# Patient Record
Sex: Male | Born: 1960 | Race: White | Hispanic: No | Marital: Married | Smoking: Never smoker
Health system: Southern US, Community
[De-identification: ages and names within clinical notes are randomized; demographics above are authoritative.]

## PROBLEM LIST (undated history)

## (undated) DIAGNOSIS — E119 Type 2 diabetes mellitus without complications: Secondary | ICD-10-CM

---

## 2005-01-07 ENCOUNTER — Ambulatory Visit (HOSPITAL_COMMUNITY): Admission: RE | Admit: 2005-01-07 | Discharge: 2005-01-07 | Payer: Self-pay | Admitting: Orthopedic Surgery

## 2005-01-07 ENCOUNTER — Ambulatory Visit (HOSPITAL_BASED_OUTPATIENT_CLINIC_OR_DEPARTMENT_OTHER): Admission: RE | Admit: 2005-01-07 | Discharge: 2005-01-07 | Payer: Self-pay | Admitting: Orthopedic Surgery

## 2007-01-22 ENCOUNTER — Ambulatory Visit (HOSPITAL_BASED_OUTPATIENT_CLINIC_OR_DEPARTMENT_OTHER): Admission: RE | Admit: 2007-01-22 | Discharge: 2007-01-22 | Payer: Self-pay | Admitting: Orthopedic Surgery

## 2007-10-12 ENCOUNTER — Ambulatory Visit: Payer: Self-pay | Admitting: Pulmonary Disease

## 2009-06-06 ENCOUNTER — Telehealth (INDEPENDENT_AMBULATORY_CARE_PROVIDER_SITE_OTHER): Payer: Self-pay | Admitting: *Deleted

## 2009-06-07 ENCOUNTER — Ambulatory Visit: Payer: Self-pay

## 2009-06-07 ENCOUNTER — Encounter: Payer: Self-pay | Admitting: Internal Medicine

## 2010-01-21 ENCOUNTER — Encounter (INDEPENDENT_AMBULATORY_CARE_PROVIDER_SITE_OTHER): Payer: Self-pay | Admitting: *Deleted

## 2010-01-21 ENCOUNTER — Encounter: Payer: Self-pay | Admitting: Gastroenterology

## 2010-01-29 ENCOUNTER — Ambulatory Visit: Payer: Self-pay | Admitting: Gastroenterology

## 2010-01-29 DIAGNOSIS — E119 Type 2 diabetes mellitus without complications: Secondary | ICD-10-CM | POA: Insufficient documentation

## 2010-02-08 ENCOUNTER — Ambulatory Visit: Payer: Self-pay | Admitting: Gastroenterology

## 2010-12-10 NOTE — Letter (Signed)
Summary: Insulin pump letter-Colonoscopy  [facility]  [address] [place], [place] [address]   Phone: [phone]  Fax: [phone]      Date: January 29, 2010  Re: Scott Mosley DOB: 25-Apr-1961 MRN: [id number]     Dear Dr. Evlyn Kanner:   Dr. Russella Dar has scheduled the above patient for a colonoscopy at 8:30am on 02/08/10.  Our records show that he/she is on insulin therapy via an insulin pump.  Our colonoscopy prep protocol requires that:   the patient must be on a clear liquid diet the entire day prior to the procedure date as well as the morning of the procedure   the patient must be NPO for 2 hours prior to the procedure    the patient must consume a PEG 3350 solution to prepare for the procedure.  Please advise [place] of any adjustments that need to be made to the patient's insulin pump therapy prior to the above procedure date.    Please route or fax back this completed form to me at [phone]  If you have any question, please call me at [phone].  Thank you for your help with this matter.  Sincerely,     Physician Recommendation:  ________________________________________________  ________________________________________________________________________  ________________________________________________________________________  ________________________________________________________________________  Appended Document: Insulin pump letter-Colonoscopy I advised patient that for his procedure he should do a 10% reduction of his bolus per Dr. Evlyn Kanner. The letter will be scanned in to EMR.

## 2010-12-10 NOTE — Procedures (Signed)
Summary: Colonoscopy  Patient: [name] Note: All result statuses are Final unless otherwise noted.  Tests: (1) Colonoscopy (COL)   COL Colonoscopy           DONE     [facility]     [address]     [place], [place]  [address]           COLONOSCOPY PROCEDURE REPORT           PATIENT:  Scott Mosley, Scott Mosley  MR#:  [id number]     BIRTHDATE:  [birth year], 49 yrs. old  GENDER:  male           ENDOSCOPIST:  Judie Petit T. Russella Dar, MD, [facility]     Referred by:  Adrian Prince, M.D.           PROCEDURE DATE:  02/08/2010     PROCEDURE:  Average-risk screening colonoscopy G0121     ASA CLASS:  Class II     INDICATIONS:  1) Routine Risk Screening           MEDICATIONS:   Fentanyl 75 mcg IV, Versed 9 mg IV           DESCRIPTION OF PROCEDURE:   After the risks benefits and     alternatives of the procedure were thoroughly explained, informed     consent was obtained.  Digital rectal exam was performed and     revealed no abnormalities.   The LB PCF-H180AL [id number] endoscope     was introduced through the anus and advanced to the cecum, which     was identified by both the appendix and ileocecal valve, limited     by a tortuous colon, fair prep.  The quality of the prep was     Moviprep fair, adequate.  The instrument was then slowly withdrawn     as the colon was fully examined.     <<PROCEDUREIMAGES>>           FINDINGS:  A normal appearing cecum, ileocecal valve, and     appendiceal orifice were identified. The ascending, hepatic     flexure, transverse, splenic flexure, descending, sigmoid colon,     and rectum appeared unremarkable.   Retroflexed views in the     rectum revealed no abnormalities.    The time to cecum =  3.67     minutes. The scope was then withdrawn (time =  11.5  min) from the     patient and the procedure completed.           COMPLICATIONS:  None           ENDOSCOPIC IMPRESSION:     1) Normal colon           RECOMMENDATIONS:     1) Repeat Colonscopy in 7 years for  routine CRC screening due to     fair prep and tortuous colon.           Venita Lick. Russella Dar, MD, [facility]           n.     eSIGNED:   Venita Lick. Neely Kammerer at 02/08/2010 08:32 AM           Ricke Hey, [phone]  Note: An exclamation mark (!) indicates a result that was not dispersed into the flowsheet. Document Creation Date: 02/08/2010 8:33 AM _______________________________________________________________________  (1) Order result status: Final Collection or observation date-time: 02/08/2010 08:29 Requested date-time:  Receipt date-time:  Reported date-time:  Referring Physician:   Ordering  Physician: Claudette Head [phone]) Specimen Source:  Source: Launa Grill Order Number: [id number] Lab site:   Appended Document: Colonoscopy    Clinical Lists Changes  Observations: Added new observation of COLONNXTDUE: 02/2017 (02/08/2010 12:55)

## 2010-12-10 NOTE — Procedures (Signed)
Summary: Insulin letter/[facility]  Insulin letter/ Gastroenterology   Imported By: Lester Millerville 02/11/2010 08:30:29  _____________________________________________________________________  External Attachment:    Type:   Image     Comment:   External Document

## 2010-12-10 NOTE — Letter (Signed)
Summary: [facility] Instructions  [facility]  [address] [place], [place] [address]   Phone: [phone]  Fax: [phone]       Scott Mosley    [birth year]    MRN: [id number]        Procedure Day /Date: Friday April 1st, 2011     Arrival Time: 7:30am     Procedure Time: 8:30am     Location of Procedure:                    _x _  [facility] (4th Floor)                        PREPARATION FOR COLONOSCOPY WITH MOVIPREP   Starting 5 days prior to your procedure  02/04/10 do not eat nuts, seeds, popcorn, corn, beans, peas,  salads, or any raw vegetables.  Do not take any fiber supplements (e.g. Metamucil, Citrucel, and Benefiber).  THE DAY BEFORE YOUR PROCEDURE         DATE:  02/07/10  DAY:  Thursday  1.  Drink clear liquids the entire day-NO SOLID FOOD  2.  Do not drink anything colored red or purple.  Avoid juices with pulp.  No orange juice.  3.  Drink at least 64 oz. (8 glasses) of fluid/clear liquids during the day to prevent dehydration and help the prep work efficiently.  CLEAR LIQUIDS INCLUDE: Water Jello Ice Popsicles Tea (sugar ok, no milk/cream) Powdered fruit flavored drinks Coffee (sugar ok, no milk/cream) Gatorade Juice: apple, white grape, white cranberry  Lemonade Clear bullion, consomm, broth Carbonated beverages (any kind) Strained chicken noodle soup Hard Candy                             4.  In the morning, mix first dose of MoviPrep solution:    Empty 1 Pouch A and 1 Pouch B into the disposable container    Add lukewarm drinking water to the top line of the container. Mix to dissolve    Refrigerate (mixed solution should be used within 24 hrs)  5.  Begin drinking the prep at 5:00 p.m. The MoviPrep container is divided by 4 marks.   Every 15 minutes drink the solution down to the next mark (approximately 8 oz) until the full liter is complete.   6.  Follow completed prep with 16 oz of clear liquid of your choice  (Nothing red or purple).  Continue to drink clear liquids until bedtime.  7.  Before going to bed, mix second dose of MoviPrep solution:    Empty 1 Pouch A and 1 Pouch B into the disposable container    Add lukewarm drinking water to the top line of the container. Mix to dissolve    Refrigerate  THE DAY OF YOUR PROCEDURE      DATE:  02/08/10  DAY:  Friday  Beginning at  3:30 a.m. (5 hours before procedure):         1. Every 15 minutes, drink the solution down to the next mark (approx 8 oz) until the full liter is complete.  2. Follow completed prep with 16 oz. of clear liquid of your choice.    3. You may drink clear liquids until  6:30am  (2 HOURS BEFORE PROCEDURE).   MEDICATION INSTRUCTIONS  Unless otherwise instructed, you should take regular prescription medications with a small sip of water  as early as possible the morning of your procedure.  Diabetic patients - see separate instructions.          OTHER INSTRUCTIONS  You will need a responsible adult at least 52 years of age to accompany you and drive you home.   This person must remain in the waiting room during your procedure.  Wear loose fitting clothing that is easily removed.  Leave jewelry and other valuables at home.  However, you may wish to bring a book to read or  an iPod/MP3 player to listen to music as you wait for your procedure to start.  Remove all body piercing jewelry and leave at home.  Total time from sign-in until discharge is approximately 2-3 hours.  You should go home directly after your procedure and rest.  You can resume normal activities the  day after your procedure.  The day of your procedure you should not:   Drive   Make legal decisions   Operate machinery   Drink alcohol   Return to work  You will receive specific instructions about eating, activities and medications before you leave.    The above instructions have been reviewed and explained to me by   Marchelle Folks.      I fully understand and can verbalize these instructions _____________________________ Date _________

## 2010-12-10 NOTE — Letter (Signed)
Summary: [facility]  [facility]   Imported By: Sherian Rein 02/04/2010 08:03:58  _____________________________________________________________________  External Attachment:    Type:   Image     Comment:   External Document

## 2010-12-10 NOTE — Letter (Signed)
Summary: Diabetic Instructions  [facility]  [address] [place], [place] [address]   Phone: [phone]  Fax: [phone]    Scott Mosley [birth year] MRN: [id number]    _x _   INSULIN PUMP MEDICATION INSTRUCTIONS  We will contact the physician managing your diabetic care for written dosage instructions for the day before your procedure and the day of your procedure.  Once we have received the instructions, we will contact you.

## 2010-12-10 NOTE — Letter (Signed)
Summary: New Patient letter  [facility]  [address] [place], [place] [address]   Phone: [phone]  Fax: [phone]       01/21/2010 MRN: [id number]  Scott Mosley [address] [place], [place]  [address]  Dear Scott Mosley,  Welcome to the Gastroenterology Division at [facility].    You are scheduled to see Dr.  Russella Dar on 01-29-10 at 11am on the 3rd floor at [facility], [address]  We ask that you try to arrive at our office 15 minutes prior to your appointment time to allow for check-in.  We would like you to complete the enclosed self-administered evaluation form prior to your visit and bring it with you on the day of your appointment.  We will review it with you.  Also, please bring a complete list of all your medications or, if you prefer, bring the medication bottles and we will list them.  Please bring your insurance card so that we may make a copy of it.  If your insurance requires a referral to see a specialist, please bring your referral form from your primary care physician.  Co-payments are due at the time of your visit and may be paid by cash, check or credit card.     Your office visit will consist of a consult with your physician (includes a physical exam), any laboratory testing he/she may order, scheduling of any necessary diagnostic testing (e.g. x-ray, ultrasound, CT-scan), and scheduling of a procedure (e.g. Endoscopy, Colonoscopy) if required.  Please allow enough time on your schedule to allow for any/all of these possibilities.    If you cannot keep your appointment, please call [phone] to cancel or reschedule prior to your appointment date.  This allows [place] the opportunity to schedule an appointment for another patient in need of care.  If you do not cancel or reschedule by 5 p.m. the business day prior to your appointment date, you will be charged a $50.00 late cancellation/no-show fee.    Thank you for choosing [facility] for your medical needs.  We appreciate the opportunity to care for you.  Please visit [place] at our website  to learn more about our practice.                     Sincerely,                                                             The Gastroenterology Division

## 2010-12-10 NOTE — Assessment & Plan Note (Signed)
Summary: SCREEN FOR COLON-ON INSULIN/YF   History of Present Illness Visit Type: Initial Consult Primary GI MD: Elie Goody MD [facility] Primary Provider: Adrian Prince, MD Requesting Provider: Adrian Prince, MD Chief Complaint: Patient here for consult for colonoscopy on insulin, he denies any GI complaints.  History of Present Illness:   This is a 50 year old male referred for colorectal cancer screening. He has diabetes mellitus managed on an insulin pump, hyperlipidemia, hypertension, and asthma, which are all stable. He has no colorectal complaints and there is no family history of colon cancer or colon polyps.   GI Review of Systems      Denies abdominal pain, acid reflux, belching, bloating, chest pain, dysphagia with liquids, dysphagia with solids, heartburn, loss of appetite, nausea, vomiting, vomiting blood, weight loss, and  weight gain.        Denies anal fissure, black tarry stools, change in bowel habit, constipation, diarrhea, diverticulosis, fecal incontinence, heme positive stool, hemorrhoids, irritable bowel syndrome, jaundice, light color stool, liver problems, rectal bleeding, and  rectal pain. Preventive Screening-Counseling & Management  Alcohol-Tobacco     Smoking Status: never      Drug Use:  no.     Current Medications (verified): 1)  Pravachol 80 Mg Tabs (Pravastatin Sodium) .... Take One By Mouth Once Daily 2)  Ramipril 10 Mg Caps (Ramipril) .... Take One By Mouth Once Daily 3)  Aspirin 81 Mg Tbec (Aspirin) .... Take One By Mouth Once Daily 4)  Humalog 100 Unit/ml Soln (Insulin Lispro (Human)) .... 28.3 Units Daily (Not Including Bolus At Meals) 5)  Multivitamins  Tabs (Multiple Vitamin) .... Take One By Mouth Once Daily  Allergies (verified): No Known Drug Allergies  Past History:  Past Medical History: Reviewed history from 01/24/2010 and no changes required. Asthma Diabetes Mellitus Type I Diabetic retinopathy Blind in left  eye Hyperlipidemia Hypertension  Past Surgical History: Laser surgery in right eye  Family History: Family History of Heart Disease: Father, paternal grandparents Family History of Ovarian Cancer:Mother Family History of Lung Cancer: Maternal Grandfather (smoker) Family History of Diabetes: paternal grandparents, father  Social History: Patient has never smoked.  Alcohol Use - no Illicit Drug Use - no Daily Caffeine Use 3-4 per day married two sons Smoking Status:  never Drug Use:  no  Review of Systems       The pertinent positives and negatives are noted as above and in the HPI. All other ROS were reviewed and were negative.   Vital Signs:  Patient profile:   50 year old male Height:      69 inches Weight:      201.2 pounds BMI:     29.82 Pulse rate:   88 / minute Pulse rhythm:   regular BP sitting:   136 / 60  (left arm) Cuff size:   regular  Vitals Entered By: Harlow Mares CMA Duncan Dull) (January 29, 2010 10:52 AM)  Physical Exam  General:  Well developed, well nourished, no acute distress. Head:  Normocephalic and atraumatic. Eyes:  PERRLA, no icterus. OS blindness. Ears:  Normal auditory acuity. Mouth:  No deformity or lesions, dentition normal. Neck:  Supple; no masses or thyromegaly. Lungs:  Clear throughout to auscultation. Heart:  Regular rate and rhythm; no murmurs, rubs,  or bruits. Abdomen:  Soft, nontender and nondistended. No masses, hepatosplenomegaly or hernias noted. Normal bowel sounds. Rectal:  deferred until time of colonoscopy.   Msk:  Symmetrical with no gross deformities. Normal posture. Pulses:  Normal pulses  noted. Extremities:  No clubbing, cyanosis, edema or deformities noted. Neurologic:  Alert and  oriented x4;  grossly normal neurologically. Cervical Nodes:  No significant cervical adenopathy. Inguinal Nodes:  No significant inguinal adenopathy. Psych:  Alert and cooperative. Normal mood and affect.  Impression &  Recommendations:  Problem # 1:  SPECIAL SCREENING FOR MALIGNANT NEOPLASMS COLON (ICD-V76.51) Average risk for colorectal cancer. The risks, benefits and alternatives to colonoscopy with possible biopsy and possible polypectomy were discussed with the patient and they consent to proceed. The procedure will be scheduled electively. Orders: Colonoscopy (Colon)  Problem # 2:  DIABETES MELLITUS (ICD-250.00) Management of his insulin pump for a clear liquid diet prior to the procedure and he is status on the morning of the procedure per Dr. Evlyn Kanner. Orders: Colonoscopy (Colon)  Patient Instructions: 1)  Colonoscopy brochure given.  2)  Please obtain instructions from Dr. Evlyn Kanner about your insulin pump. 3)  Copy sent to : Adrian Prince, MD 4)  The medication list was reviewed and reconciled.  All changed / newly prescribed medications were explained.  A complete medication list was provided to the patient / caregiver.  Prescriptions: MOVIPREP 100 GM  SOLR (PEG-KCL-NACL-NASULF-NA ASC-C) As per prep instructions.  #1 x 0   Entered by:   Christie Nottingham CMA (AAMA)   Authorized by:   Meryl Dare MD [facility]   Signed by:   Christie Nottingham CMA (AAMA) on 01/29/2010   Method used:   Electronically to        [facility]  [address] [id number] ([facility])       [address]       [place], [place]  [address]       Ph: [phone]       Fax: [phone]   RxID:   [id number]

## 2011-03-25 NOTE — Assessment & Plan Note (Signed)
Scott Mosley                             PULMONARY OFFICE NOTE   Scott Mosley, Scott Mosley                         MRN:          [id number]  DATE:10/12/2007                            DOB:          1961-11-08    HISTORY OF PRESENT ILLNESS:  Scott Mosley is a 50 year old type 1 diabetic  who is referred for an evaluation of chronic cough.  He describes cough  that has been present for several years but has worsened in the last  three months.  This is nonproductive.  It initially started off as an  upper airway cough but worsened in November and is now slightly better  again.  It is improved in cold weather and he does not have any specific  trigger.  There is no alleviating factor, no variation.  He does not  have described postnasal drip, wheezing or heartburn.  There is no  history of GERD or peptic ulcer disease.  He has been on Ramipril for  many years.  He describes an episode of bronchitis followed by post-  bronchitic cough and states that this feels like this.  He has a  childhood asthma but denies wheezing, dyspnea, chest pain or  palpitations.  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  An x-ray was done at Dr. Rinaldo Cloud office and was  reportedly normal as per the patient.   PAST MEDICAL HISTORY:  Includes a childhood history of asthma, type 1  diabetes with retinopathy, blind in the left eye, laser surgery in the  right eye.   ALLERGIES:  None.   CURRENT MEDICATIONS:  1. NovoLog insulin pump.  2. Ramipril 10 mg daily.  3. Pravastatin 40 mg daily.  4. Aspirin 81 mg daily.  5. Multivitamin.   SOCIAL HISTORY:  Lifetime nonsmoker.  No history of alcohol use. He is  married and lives with his wife and children.  He is currently disabled.   FAMILY HISTORY:  Leukemia in grandmother and lung cancer in grandfather.  Father had heart disease.   REVIEW OF SYSTEMS:  As above.  No chest pain, palpitations or heartburn,  wheezing.   PHYSICAL EXAMINATION:  VITAL SIGNS:  Height 5 feet 9 inches, weight 200  pounds.  Blood pressure 140/70.  Temperature 99.  Oxygen saturation 97%  on room air.  Heart rate 90 per minute.  HEENT:  Enlarged tonsils.  No postnasal drip.  Edematous uvula.  CARDIOVASCULAR:  S1, S2 normal.  No S3. No rub.  CHEST: Clear to auscultation.  ABDOMEN:  Soft, nontender.  EXTREMITIES:  No edema.   IMPRESSION:  Likely Ramipril-induced cough in this 50 year old diabetic  nonsmoker.  Of note, an ACE inhibitor can cause a cough after many  years.  The description of his cough is very consistent with this.  I  think the first step would be to stop the Ramipril and see if his cough  improves.  I have explained to him it can take as long as 8 weeks for  the cough to improve.  He will monitor his  blood pressure off the  Ramipril.  If this remains high, we could substitute an ARB such as  Diovan for this.   If his cough is persistent, he will return for a followup in about 8  weeks time.  I will try to track down his chest x-ray in the meantime.     Oretha Milch, MD  Electronically Signed    RVA/MedQ  DD: 10/12/2007  DT: 10/13/2007  Job #: [id number]   cc:   Jeannett Senior A. Evlyn Kanner, M.D.

## 2011-03-28 NOTE — Op Note (Signed)
Scott Mosley, JURY                  ACCOUNT NO.:  [id number]   MEDICAL RECORD NO.:  [id number]          PATIENT TYPE:  AMB   LOCATION:  DSC                          FACILITY:  [facility]   PHYSICIAN:  Katy Fitch. Sypher, M.D. 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   OPERATION:  Release of left transverse carpal ligament.   SURGEON:  Katy Fitch. Sypher, M.D.   ASSISTANT:  Marveen Reeks. Dasnoit, P.A.-C.   ANESTHESIA:  General by LMA.   SUPERVISING ANESTHESIOLOGIST:  Zenon Mayo, M.D.   INDICATIONS:  Shariq Puig is a 50 year old gentleman referred through the  courtesy of Dr. Ardyth Harps for evaluation and management of left hand  numbness.  He has insulin dependent diabetes and has been using an  insulin pump for nearly 20 years.  He is status post release of his  right transverse carpal ligament by our practice in the past with a good  result.  He now presents for left side surgery.  Prior electrodiagnostic  studies revealed severe left carpal tunnel syndrome.  After informed  consent and anesthesia consultation by Dr. Sampson Goon, general  anesthesia by LMA was selected.   PROCEDURE:  Allayne Gitelman. Fray is brought to the operating room and placed in  the supine position on the operating table.  Following the induction of  general anesthesia by LMA technique, the left arm was prepped with  Betadine soap solution and sterilely draped.  A pneumatic tourniquet was  applied to the proximal brachium.  Following exsanguination of the left  arm with an Esmarch bandage, the arterial tourniquet was inflated to 220  mmHg.  The procedure commenced with a 1.5 cm incision in the palm in the  line of the ring finger.  The subcutaneous  tissues were carefully  divided revealing the palmar fascia.  There was noted be generalized  contraction of the palmar fascia including significant MP and PIP  flexion fractures of all fingers.  The finger flexion contractures made  exposure of the carpal canal rather challenging.  Due to the presence of  the contracture, we had to extend the incision more proximally to a 2.5  cm incision.  The palmar fascia was split longitudinally revealing the  common sensory branch of the median nerve and superficial palmar arch.  The plane between the transverse carpal ligament and the flexor  tenosynovium and median nerve was developed with a Penfield 4 elevator  followed by the use of scissors to release the transverse carpal  ligament and volar forearm fascia on the ulnar aspect of the canal into  the distal forearm.  This widely opened the carpal canal.  The contents  of the canal were inspected and found to be notable for rather fibrotic  appearing tenosynovium and a hyperemic appearing nerve.   The wound was then inspected for bleeding points  which were  electrocauterized with bipolar current followed by repair of the skin  with intradermal 3-0 Prolene suture.  A compressive dressing was applied  with a volar plaster splint maintaining the wrist in 5 degrees  dorsiflexion.  There were no apparent complications.   For aftercare, Mr. Littlefield is provided a prescription for Percocet 5 mg, 1-  2 tablets p.o. q.4-6h. p.r.n. pain, 20 tablets without refill.      Katy Fitch Sypher, M.D.  Electronically Signed     RVS/MEDQ  D:  01/22/2007  T:  01/23/2007  Job:  [id number]

## 2011-03-28 NOTE — Op Note (Signed)
Scott Mosley                  ACCOUNT NO.:  [id number]   MEDICAL RECORD NO.:  [id number]          PATIENT TYPE:  AMB   LOCATION:  DSC                          FACILITY:  [facility]   PHYSICIAN:  Katy Fitch. Sypher Jr., M.D.DATE OF BIRTH:  [birth year]   DATE OF PROCEDURE:  01/07/2005  DATE OF DISCHARGE:                                 OPERATIVE REPORT   PREOPERATIVE DIAGNOSIS:  Severe right carpal tunnel syndrome, consequent to  recent wrist fracture and history of background of insulin-dependent  diabetes.   POSTOPERATIVE DIAGNOSIS:  Severe right carpal tunnel syndrome, consequent to  recent wrist fracture and history of background of insulin-dependent  diabetes.   OPERATIONS:  Release of right transcarpal ligament.   OPERATIONS:  Scott Mosley, M.D.   ASSISTANT:  Annye Rusk, P.A.-C.   ANESTHESIA:  General by LMA. Supervising anesthesiologist Dr. Hart Robinsons.   INDICATIONS:  Scott Mosley is a 50 year old right-hand dominant man well  acquainted with our practice.   Last year, he fell sustaining a wrist fracture. He was treated by Dr.  Durene Romans of [facility]. His wrist fracture was treated  with closed technique and casting.   He was on to heal his fraction a satisfactory manner; however, he began to  experience progressive numbness and discomfort in the median __________  fingers of the right hand. He elected to seek a hand surgery consult  regarding this predicament.   Clinical examination revealed signs of thenar atrophy and loss of stability  in the median distribution with preservation of the radial and ulnar  distribution, sensory and motor function.   Detailed electric diagnostic studies were accomplished by Dr. Johna Roles  revealing profound right carpal tunnel syndrome with a motor latency in  excess of 10 milliseconds.   Due to a failure to respond to nonoperative measures, he is brought to  operating this time for release of his  right transverse carpal ligament.   PROCEDURE:  Scott Mosley is brought to the operating room and placed in supine  position on the table. Following induction of general anesthesia by LMA, the  right eye was prepped with Betadine solution, sterilely draped. A pneumatic  tourniquet was applied to the proximal brachium.   Following exsanguination of the limb with Esmarch bandage, the arterial  tourniquet was elevated to 220 mmHg. Procedure commenced with short incision  in the line of the ring finger and the palm. Subcutaneous tissues were  carefully divided revealing a rather dry and fibrotic-appearing palmar  fascia. This was split in line of its fibers to reveal the __________ branch  of the median nerve and superficial palmar arch. __________ branches were  followed back to the median nerve proper which was just gently isolated from  the transcarpal ligament with a Insurance risk surveyor.   The ligament released along its ulnar border adjacent to the hook of hamate,  taking care to carefully release the bursa head of the scissors with a  Penfield 4 retractor.   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   A second transverse incision was fashioned in the distal wrist flexion  crease to facilitate exposure of the volar forearm fascia, volar carpal  ligament and remnants of the transverse carpal ligament.   Complete release was assured from mid palm to approximately 5 cm above the  wrist.   The wound was inspected for bleeding points which were electrocauterized  with bipolar current followed by repair the skin with intradermal 3-0  Prolene suture.   A compressive dressing was applied with volar plasty splint maintaining the  wrist in 5 degrees dorsi flexion.   For aftercare, Scott Mosley is given prescription for Percocet 5 mg 1 p.o. q.4-  6h. p.r.n. pain, 20 tablets without refill.   We  will see him back for followup in 7 days for dressing change and  advancement to his  therapy program.      RVS/MEDQ  D:  01/07/2005  T:  01/07/2005  Job:  [id number]   cc:   Madlyn Frankel Charlann Boxer, M.D.  [facility]  [address]  [address]  [place]  [place] [address]  Fax: [phone]

## 2014-08-02 ENCOUNTER — Encounter: Payer: Self-pay | Admitting: Gastroenterology

## 2015-12-29 ENCOUNTER — Ambulatory Visit (INDEPENDENT_AMBULATORY_CARE_PROVIDER_SITE_OTHER): Payer: Medicare Other | Admitting: Physician Assistant

## 2015-12-29 VITALS — BP 132/60 | HR 120 | Temp 101.7°F | Resp 16 | Ht 69.0 in | Wt 199.0 lb

## 2015-12-29 DIAGNOSIS — R509 Fever, unspecified: Secondary | ICD-10-CM

## 2015-12-29 DIAGNOSIS — R52 Pain, unspecified: Secondary | ICD-10-CM | POA: Diagnosis not present

## 2015-12-29 DIAGNOSIS — R059 Cough, unspecified: Secondary | ICD-10-CM

## 2015-12-29 DIAGNOSIS — J111 Influenza due to unidentified influenza virus with other respiratory manifestations: Secondary | ICD-10-CM | POA: Diagnosis not present

## 2015-12-29 DIAGNOSIS — R05 Cough: Secondary | ICD-10-CM | POA: Diagnosis not present

## 2015-12-29 LAB — POCT INFLUENZA A/B
INFLUENZA A, POC: POSITIVE — AB
Influenza B, POC: NEGATIVE

## 2015-12-29 MED ORDER — GUAIFENESIN ER 1200 MG PO TB12: 1.0000 | ORAL_TABLET | Freq: Two times a day (BID) | ORAL | Status: DC | PRN

## 2015-12-29 MED ORDER — OSELTAMIVIR PHOSPHATE 75 MG PO CAPS: 75.0000 mg | ORAL_CAPSULE | Freq: Two times a day (BID) | ORAL | Status: DC

## 2015-12-29 MED ORDER — HYDROCOD POLST-CPM POLST ER 10-8 MG/5ML PO SUER: 5.0000 mL | Freq: Every evening | ORAL | Status: DC | PRN

## 2015-12-29 MED ORDER — IBUPROFEN 200 MG PO TABS
200.0000 mg | ORAL_TABLET | Freq: Once | ORAL | Status: AC
  Administered 2015-12-29: 200 mg via ORAL

## 2015-12-29 MED ORDER — BENZONATATE 100 MG PO CAPS: 100.0000 mg | ORAL_CAPSULE | Freq: Three times a day (TID) | ORAL | Status: DC | PRN

## 2015-12-29 NOTE — Progress Notes (Signed)
[facility] [address] [place] [address] [phone]  Date:  12/29/2015   Name:  Scott Mosley   DOB:  [birth year]   MRN:  [id number]  PCP:  No primary care provider on file.   Chief Complaint  Patient presents with  . Cough    began Tuesday night  . Sore Throat  . Chills  . Fever    History of Present Illness:  Scott Mosley is a 55 y.o. male patient who presents to [facility] for chief complaint of fever, cough, and sore throat.   4 days ago, patient developed body aches, subjective fever and chills.  2 days later a cough worsened.  It is minimally productive cough of brownish sputum, this is moreso in the morning.  He has no sob, dyspnea, or windedness with activity.  Throat is minimally sore.  No nasal congestion or otalgia.  This morning, he developed a documented fever of 100 and given 2  of advil about 3 hours ago.   His dm is well controlled.  He is on insulin and testing invokana--though he states he has type 1 diabetes--this is an experiment....     Patient Active Problem List   Diagnosis Date Noted  . DIABETES MELLITUS 01/29/2010    No past medical history on file.  No past surgical history on file.  Social History  Substance Use Topics  . Smoking status: Never Smoker   . Smokeless tobacco: Not on file  . Alcohol Use: No    No family history on file.  Allergies no known allergies  Medication list has been reviewed and updated.  No current outpatient prescriptions on file prior to visit.   No current facility-administered medications on file prior to visit.   Chief Complaint  Patient presents with  . Cough    began Tuesday night  . Sore Throat  . Chills  . Fever    ROS ROS otherwise unremarkable unless listed above.  Physical Examination: BP 132/60 mmHg  Pulse 120  Temp(Src) 101.7 F (38.7 C) (Oral)  Resp 16  Ht  (1.753 m)  Wt 199 lb (90.266 kg)  BMI 29.37 kg/m2  SpO2 96% Ideal Body Weight: Weight in (lb)  to have BMI = 25: 168.9  Physical Exam  Constitutional: He is oriented to person, place, and time. He appears well-developed and well-nourished. No distress.  HENT:  Head: Atraumatic.  Right Ear: Tympanic membrane, external ear and ear canal normal.  Left Ear: Tympanic membrane, external ear and ear canal normal.  Nose: Mucosal edema and rhinorrhea present. Right sinus exhibits no maxillary sinus tenderness and no frontal sinus tenderness. Left sinus exhibits no maxillary sinus tenderness and no frontal sinus tenderness.  Mouth/Throat: No uvula swelling. No oropharyngeal exudate, posterior oropharyngeal edema or posterior oropharyngeal erythema.  Eyes: Conjunctivae, EOM and lids are normal. Pupils are equal, round, and reactive to light. Right eye exhibits normal extraocular motion. Left eye exhibits normal extraocular motion.  Neck: Trachea normal and full passive range of motion without pain. No edema and no erythema present.  Cardiovascular: Normal rate.   Pulmonary/Chest: Effort normal. No respiratory distress. He has no decreased breath sounds. He has no wheezes. He has no rhonchi.  Lymphadenopathy:       Head (right side): No submental, no submandibular, no tonsillar, no preauricular and no posterior auricular adenopathy present.       Head (left side): No submental, no submandibular, no tonsillar, no preauricular and  no posterior auricular adenopathy present.    He has no cervical adenopathy.  Neurological: He is alert and oriented to person, place, and time.  Skin: Skin is warm and dry. He is not diaphoretic.  Psychiatric: He has a normal mood and affect. His behavior is normal.     Results for orders placed or performed in visit on 12/29/15  POCT Influenza A/B  Result Value Ref Range   Influenza A, POC Positive (A) Negative   Influenza B, POC Negative Negative     Assessment and Plan: [name] is a 55 y.o. male who is here today for congestion, bodyaches, fever, and  cough. I have suggested tamiflu though outside of the window, and patient and wife appear would like this, to aid in the duration and severity of sxs.    Influenza - Plan: Guaifenesin (MUCINEX MAXIMUM STRENGTH) 1200 MG TB12, benzonatate (TESSALON) 100 MG capsule, chlorpheniramine-HYDROcodone (TUSSIONEX PENNKINETIC ER) 10-8 MG/5ML SUER, oseltamivir (TAMIFLU) 75 MG capsule  Fever, unspecified fever cause - Plan: ibuprofen (ADVIL,MOTRIN) tablet 200 mg, POCT Influenza A/B  Cough - Plan: POCT Influenza A/B  Body aches - Plan: POCT Influenza A/B  Trena Platt, PA-C [facility] 2/19/20177:50 AM

## 2015-12-29 NOTE — Patient Instructions (Signed)
Please hydrate well with 64 oz of water per day. I would like you to take the mucinex at home as prescribed.  You do not have to pick up the mucinex prescribed.  Influenza, Adult Influenza ("the flu") is a viral infection of the respiratory tract. It occurs more often in winter months because people spend more time in close contact with one another. Influenza can make you feel very sick. Influenza easily spreads from person to person (contagious). CAUSES  Influenza is caused by a virus that infects the respiratory tract. You can catch the virus by breathing in droplets from an infected person's cough or sneeze. You can also catch the virus by touching something that was recently contaminated with the virus and then touching your mouth, nose, or eyes. RISKS AND COMPLICATIONS You may be at risk for a more severe case of influenza if you smoke cigarettes, have diabetes, have chronic heart disease (such as heart failure) or lung disease (such as asthma), or if you have a weakened immune system. Elderly people and pregnant women are also at risk for more serious infections. The most common problem of influenza is a lung infection (pneumonia). Sometimes, this problem can require emergency medical care and may be life threatening. SIGNS AND SYMPTOMS  Symptoms typically last 4 to 10 days and may include:  Fever.  Chills.  Headache, body aches, and muscle aches.  Sore throat.  Chest discomfort and cough.  Poor appetite.  Weakness or feeling tired.  Dizziness.  Nausea or vomiting. DIAGNOSIS  Diagnosis of influenza is often made based on your history and a physical exam. A nose or throat swab test can be done to confirm the diagnosis. TREATMENT  In mild cases, influenza goes away on its own. Treatment is directed at relieving symptoms. For more severe cases, your health care provider may prescribe antiviral medicines to shorten the sickness. Antibiotic medicines are not effective because the  infection is caused by a virus, not by bacteria. HOME CARE INSTRUCTIONS  Take medicines only as directed by your health care provider.  Use a cool mist humidifier to make breathing easier.  Get plenty of rest until your temperature returns to normal. This usually takes 3 to 4 days.  Drink enough fluid to keep your urine clear or pale yellow.  Cover yourmouth and nosewhen coughing or sneezing,and wash your handswellto prevent thevirusfrom spreading.  Stay homefromwork orschool untilthe fever is gonefor at least 4full day. PREVENTION  An annual influenza vaccination (flu shot) is the best way to avoid getting influenza. An annual flu shot is now routinely recommended for all adults in the [place] SEEK MEDICAL CARE IF:  You experiencechest pain, yourcough worsens,or you producemore mucus.  Youhave nausea,vomiting, ordiarrhea.  Your fever returns or gets worse. SEEK IMMEDIATE MEDICAL CARE IF:  You havetrouble breathing, you become short of breath,or your skin ornails becomebluish.  You have severe painor stiffnessin the neck.  You develop a sudden headache, or pain in the face or ear.  You have nausea or vomiting that you cannot control. MAKE SURE YOU:   Understand these instructions.  Will watch your condition.  Will get help right away if you are not doing well or get worse.   This information is not intended to replace advice given to you by your health care provider. Make sure you discuss any questions you have with your health care provider.   Document Released: 10/24/2000 Document Revised: 11/17/2014 Document Reviewed: 01/26/2012 Elsevier Interactive Patient Education [employer]

## 2015-12-30 ENCOUNTER — Encounter: Payer: Self-pay | Admitting: Physician Assistant

## 2016-01-07 DIAGNOSIS — H35371 Puckering of macula, right eye: Secondary | ICD-10-CM | POA: Diagnosis not present

## 2016-01-07 DIAGNOSIS — H44522 Atrophy of globe, left eye: Secondary | ICD-10-CM | POA: Diagnosis not present

## 2016-01-07 DIAGNOSIS — E1039 Type 1 diabetes mellitus with other diabetic ophthalmic complication: Secondary | ICD-10-CM | POA: Diagnosis not present

## 2016-01-07 DIAGNOSIS — E103551 Type 1 diabetes mellitus with stable proliferative diabetic retinopathy, right eye: Secondary | ICD-10-CM | POA: Diagnosis not present

## 2016-01-08 DIAGNOSIS — I10 Essential (primary) hypertension: Secondary | ICD-10-CM | POA: Diagnosis not present

## 2016-01-08 DIAGNOSIS — E10359 Type 1 diabetes mellitus with proliferative diabetic retinopathy without macular edema: Secondary | ICD-10-CM | POA: Diagnosis not present

## 2016-01-08 DIAGNOSIS — H02402 Unspecified ptosis of left eyelid: Secondary | ICD-10-CM | POA: Diagnosis not present

## 2016-01-08 DIAGNOSIS — H18411 Arcus senilis, right eye: Secondary | ICD-10-CM | POA: Diagnosis not present

## 2016-01-10 DIAGNOSIS — E109 Type 1 diabetes mellitus without complications: Secondary | ICD-10-CM | POA: Diagnosis not present

## 2016-02-11 DIAGNOSIS — E103593 Type 1 diabetes mellitus with proliferative diabetic retinopathy without macular edema, bilateral: Secondary | ICD-10-CM | POA: Diagnosis not present

## 2016-02-11 DIAGNOSIS — E1039 Type 1 diabetes mellitus with other diabetic ophthalmic complication: Secondary | ICD-10-CM | POA: Diagnosis not present

## 2016-02-11 DIAGNOSIS — I10 Essential (primary) hypertension: Secondary | ICD-10-CM | POA: Diagnosis not present

## 2016-02-11 DIAGNOSIS — Z6829 Body mass index (BMI) 29.0-29.9, adult: Secondary | ICD-10-CM | POA: Diagnosis not present

## 2016-02-11 DIAGNOSIS — E669 Obesity, unspecified: Secondary | ICD-10-CM | POA: Diagnosis not present

## 2016-02-11 DIAGNOSIS — E784 Other hyperlipidemia: Secondary | ICD-10-CM | POA: Diagnosis not present

## 2016-02-11 DIAGNOSIS — N400 Enlarged prostate without lower urinary tract symptoms: Secondary | ICD-10-CM | POA: Diagnosis not present

## 2016-02-11 DIAGNOSIS — Z1389 Encounter for screening for other disorder: Secondary | ICD-10-CM | POA: Diagnosis not present

## 2016-02-14 DIAGNOSIS — E108 Type 1 diabetes mellitus with unspecified complications: Secondary | ICD-10-CM | POA: Diagnosis not present

## 2016-06-11 DIAGNOSIS — E108 Type 1 diabetes mellitus with unspecified complications: Secondary | ICD-10-CM | POA: Diagnosis not present

## 2016-06-17 DIAGNOSIS — E668 Other obesity: Secondary | ICD-10-CM | POA: Diagnosis not present

## 2016-06-17 DIAGNOSIS — E784 Other hyperlipidemia: Secondary | ICD-10-CM | POA: Diagnosis not present

## 2016-06-17 DIAGNOSIS — N400 Enlarged prostate without lower urinary tract symptoms: Secondary | ICD-10-CM | POA: Diagnosis not present

## 2016-06-17 DIAGNOSIS — E113593 Type 2 diabetes mellitus with proliferative diabetic retinopathy without macular edema, bilateral: Secondary | ICD-10-CM | POA: Diagnosis not present

## 2016-06-17 DIAGNOSIS — I10 Essential (primary) hypertension: Secondary | ICD-10-CM | POA: Diagnosis not present

## 2016-06-17 DIAGNOSIS — E1039 Type 1 diabetes mellitus with other diabetic ophthalmic complication: Secondary | ICD-10-CM | POA: Diagnosis not present

## 2016-06-17 DIAGNOSIS — E1142 Type 2 diabetes mellitus with diabetic polyneuropathy: Secondary | ICD-10-CM | POA: Diagnosis not present

## 2016-06-17 DIAGNOSIS — Z6829 Body mass index (BMI) 29.0-29.9, adult: Secondary | ICD-10-CM | POA: Diagnosis not present

## 2016-06-19 DIAGNOSIS — E109 Type 1 diabetes mellitus without complications: Secondary | ICD-10-CM | POA: Diagnosis not present

## 2016-06-20 DIAGNOSIS — E109 Type 1 diabetes mellitus without complications: Secondary | ICD-10-CM | POA: Diagnosis not present

## 2016-07-07 DIAGNOSIS — H44522 Atrophy of globe, left eye: Secondary | ICD-10-CM | POA: Diagnosis not present

## 2016-07-07 DIAGNOSIS — H35371 Puckering of macula, right eye: Secondary | ICD-10-CM | POA: Diagnosis not present

## 2016-07-07 DIAGNOSIS — H26491 Other secondary cataract, right eye: Secondary | ICD-10-CM | POA: Diagnosis not present

## 2016-07-07 DIAGNOSIS — E103551 Type 1 diabetes mellitus with stable proliferative diabetic retinopathy, right eye: Secondary | ICD-10-CM | POA: Diagnosis not present

## 2016-08-27 DIAGNOSIS — Z23 Encounter for immunization: Secondary | ICD-10-CM | POA: Diagnosis not present

## 2016-10-16 DIAGNOSIS — I10 Essential (primary) hypertension: Secondary | ICD-10-CM | POA: Diagnosis not present

## 2016-10-16 DIAGNOSIS — E784 Other hyperlipidemia: Secondary | ICD-10-CM | POA: Diagnosis not present

## 2016-10-16 DIAGNOSIS — Z6829 Body mass index (BMI) 29.0-29.9, adult: Secondary | ICD-10-CM | POA: Diagnosis not present

## 2016-10-16 DIAGNOSIS — E113599 Type 2 diabetes mellitus with proliferative diabetic retinopathy without macular edema, unspecified eye: Secondary | ICD-10-CM | POA: Diagnosis not present

## 2016-10-16 DIAGNOSIS — N400 Enlarged prostate without lower urinary tract symptoms: Secondary | ICD-10-CM | POA: Diagnosis not present

## 2016-10-16 DIAGNOSIS — E1039 Type 1 diabetes mellitus with other diabetic ophthalmic complication: Secondary | ICD-10-CM | POA: Diagnosis not present

## 2016-10-16 DIAGNOSIS — E1142 Type 2 diabetes mellitus with diabetic polyneuropathy: Secondary | ICD-10-CM | POA: Diagnosis not present

## 2016-10-31 DIAGNOSIS — E109 Type 1 diabetes mellitus without complications: Secondary | ICD-10-CM | POA: Diagnosis not present

## 2016-11-04 DIAGNOSIS — E108 Type 1 diabetes mellitus with unspecified complications: Secondary | ICD-10-CM | POA: Diagnosis not present

## 2016-12-19 ENCOUNTER — Ambulatory Visit (INDEPENDENT_AMBULATORY_CARE_PROVIDER_SITE_OTHER): Payer: Medicare Other | Admitting: Family Medicine

## 2016-12-19 VITALS — BP 124/80 | HR 112 | Temp 98.0°F | Resp 17 | Ht 68.5 in | Wt 197.0 lb

## 2016-12-19 DIAGNOSIS — R6889 Other general symptoms and signs: Secondary | ICD-10-CM

## 2016-12-19 DIAGNOSIS — R059 Cough, unspecified: Secondary | ICD-10-CM

## 2016-12-19 DIAGNOSIS — J329 Chronic sinusitis, unspecified: Secondary | ICD-10-CM

## 2016-12-19 DIAGNOSIS — B349 Viral infection, unspecified: Secondary | ICD-10-CM

## 2016-12-19 DIAGNOSIS — R05 Cough: Secondary | ICD-10-CM | POA: Diagnosis not present

## 2016-12-19 LAB — POCT INFLUENZA A/B
Influenza A, POC: NEGATIVE
Influenza B, POC: NEGATIVE

## 2016-12-19 MED ORDER — AMOXICILLIN 875 MG PO TABS: 875.0000 mg | ORAL_TABLET | Freq: Two times a day (BID) | ORAL | 0 refills | Status: DC

## 2016-12-19 MED ORDER — GUAIFENESIN-CODEINE 100-10 MG/5ML PO SOLN: ORAL | 0 refills | Status: DC

## 2016-12-19 NOTE — Progress Notes (Signed)
Patient ID: Scott Mosley, male    DOB: July 02, 1961  Age: 56 y.o. MRN: [id number]  Chief Complaint  Patient presents with  . Chills  . URI    Subjective:   Patient is been ill for a couple of weeks with a respiratory tract infection. He thought he might have had a flulike illness maybe 3 weeks ago. It has residually hung on. Then early this week he started feeling worse. Yesterday he had fever and chills and some nausea today. Wife had had similar illness January but she got over it.  Current allergies, medications, problem list, past/family and social histories reviewed.  Objective:  BP 124/80 (BP Location: Right Arm, Patient Position: Sitting, Cuff Size: Normal)   Pulse (!) 112   Temp 98 F (36.7 C) (Oral)   Resp 17   Ht 5' 8.5" (1.74 m)   Wt 197 lb (89.4 kg)   SpO2 97%   BMI 29.52 kg/m   No major distress. TMs normal. Throat clear. Neck supple without nodes. Mild sinus tenderness. Chest clear. Heart regular without murmur. Abdomen soft without mass or tenderness.  Assessment & Plan:   Assessment: 1. Flu-like symptoms   2. Viral syndrome   3. Sinusitis, unspecified chronicity, unspecified location   4. Cough       Plan: Flu test was negative. I suspect this is a post-influenza secondary infection at this point. Went ahead and treated him with an antibiotic  Orders Placed This Encounter  Procedures  . POCT Influenza A/B    Meds ordered this encounter  Medications  . amoxicillin (AMOXIL) 875 MG tablet    Sig: Take 1 tablet (875 mg total) by mouth 2 (two) times daily.    Dispense:  20 tablet    Refill:  0  . guaiFENesin-codeine 100-10 MG/5ML syrup    Sig: Take 1-2 teaspoons (5-10 mL) every 6 hours or so if needed for cough    Dispense:  120 mL    Refill:  0         Patient Instructions   Drink plenty of fluids and get enough rest  Take Tylenol or ibuprofen as needed for fever or aching  Use an over-the-counter antihistamine decongestant such as  Claritin-D or Allegra-D or Zyrtec-D if needed for head congestion  Take the amoxicillin 875 mg one twice daily until complete  Return if not improving  Take the guaifenesin with codeine cough syrup 1-2 teaspoon every 6 hours as needed for cough    IF you received an x-ray today, you will receive an invoice from [facility]. Please contact [facility] at [phone] with questions or concerns regarding your invoice.   IF you received labwork today, you will receive an invoice from [facility]. Please contact [facility] at [phone] with questions or concerns regarding your invoice.   Our billing staff will not be able to assist you with questions regarding bills from these companies.  You will be contacted with the lab results as soon as they are available. The fastest way to get your results is to activate your My Chart account. Instructions are located on the last page of this paperwork. If you have not heard from us regarding the results in 2 weeks, please contact this office.         No Follow-up on file.   HOPPER,DAVID, MD 12/19/2016

## 2016-12-19 NOTE — Patient Instructions (Addendum)
Drink plenty of fluids and get enough rest  Take Tylenol or ibuprofen as needed for fever or aching  Use an over-the-counter antihistamine decongestant such as Claritin-D or Allegra-D or Zyrtec-D if needed for head congestion  Take the amoxicillin 875 mg one twice daily until complete  Return if not improving  Take the guaifenesin with codeine cough syrup 1-2 teaspoon every 6 hours as needed for cough    IF you received an x-ray today, you will receive an invoice from [facility]. Please contact [facility] at [phone] with questions or concerns regarding your invoice.   IF you received labwork today, you will receive an invoice from [facility]. Please contact [facility] at [phone] with questions or concerns regarding your invoice.   Our billing staff will not be able to assist you with questions regarding bills from these companies.  You will be contacted with the lab results as soon as they are available. The fastest way to get your results is to activate your My Chart account. Instructions are located on the last page of this paperwork. If you have not heard from us regarding the results in 2 weeks, please contact this office.

## 2016-12-22 ENCOUNTER — Encounter: Payer: Self-pay | Admitting: Gastroenterology

## 2017-01-06 DIAGNOSIS — H26491 Other secondary cataract, right eye: Secondary | ICD-10-CM | POA: Diagnosis not present

## 2017-01-06 DIAGNOSIS — E103551 Type 1 diabetes mellitus with stable proliferative diabetic retinopathy, right eye: Secondary | ICD-10-CM | POA: Diagnosis not present

## 2017-01-06 DIAGNOSIS — H35371 Puckering of macula, right eye: Secondary | ICD-10-CM | POA: Diagnosis not present

## 2017-01-06 DIAGNOSIS — E1039 Type 1 diabetes mellitus with other diabetic ophthalmic complication: Secondary | ICD-10-CM | POA: Diagnosis not present

## 2017-01-13 DIAGNOSIS — E103499 Type 1 diabetes mellitus with severe nonproliferative diabetic retinopathy without macular edema, unspecified eye: Secondary | ICD-10-CM | POA: Diagnosis not present

## 2017-01-13 DIAGNOSIS — H02839 Dermatochalasis of unspecified eye, unspecified eyelid: Secondary | ICD-10-CM | POA: Diagnosis not present

## 2017-01-13 DIAGNOSIS — H44522 Atrophy of globe, left eye: Secondary | ICD-10-CM | POA: Diagnosis not present

## 2017-01-13 DIAGNOSIS — Z961 Presence of intraocular lens: Secondary | ICD-10-CM | POA: Diagnosis not present

## 2017-03-02 DIAGNOSIS — N400 Enlarged prostate without lower urinary tract symptoms: Secondary | ICD-10-CM | POA: Diagnosis not present

## 2017-03-02 DIAGNOSIS — E1039 Type 1 diabetes mellitus with other diabetic ophthalmic complication: Secondary | ICD-10-CM | POA: Diagnosis not present

## 2017-03-02 DIAGNOSIS — I10 Essential (primary) hypertension: Secondary | ICD-10-CM | POA: Diagnosis not present

## 2017-03-02 DIAGNOSIS — E1042 Type 1 diabetes mellitus with diabetic polyneuropathy: Secondary | ICD-10-CM | POA: Diagnosis not present

## 2017-03-10 DIAGNOSIS — E109 Type 1 diabetes mellitus without complications: Secondary | ICD-10-CM | POA: Diagnosis not present

## 2017-03-11 DIAGNOSIS — E109 Type 1 diabetes mellitus without complications: Secondary | ICD-10-CM | POA: Diagnosis not present

## 2017-03-13 DIAGNOSIS — E108 Type 1 diabetes mellitus with unspecified complications: Secondary | ICD-10-CM | POA: Diagnosis not present

## 2017-05-21 DIAGNOSIS — H5213 Myopia, bilateral: Secondary | ICD-10-CM | POA: Diagnosis not present

## 2017-06-29 DIAGNOSIS — E109 Type 1 diabetes mellitus without complications: Secondary | ICD-10-CM | POA: Diagnosis not present

## 2017-06-30 DIAGNOSIS — E109 Type 1 diabetes mellitus without complications: Secondary | ICD-10-CM | POA: Diagnosis not present

## 2017-07-01 DIAGNOSIS — E1142 Type 2 diabetes mellitus with diabetic polyneuropathy: Secondary | ICD-10-CM | POA: Diagnosis not present

## 2017-07-01 DIAGNOSIS — N400 Enlarged prostate without lower urinary tract symptoms: Secondary | ICD-10-CM | POA: Diagnosis not present

## 2017-07-01 DIAGNOSIS — E113599 Type 2 diabetes mellitus with proliferative diabetic retinopathy without macular edema, unspecified eye: Secondary | ICD-10-CM | POA: Diagnosis not present

## 2017-07-01 DIAGNOSIS — E1039 Type 1 diabetes mellitus with other diabetic ophthalmic complication: Secondary | ICD-10-CM | POA: Diagnosis not present

## 2017-07-07 DIAGNOSIS — E103551 Type 1 diabetes mellitus with stable proliferative diabetic retinopathy, right eye: Secondary | ICD-10-CM | POA: Diagnosis not present

## 2017-07-07 DIAGNOSIS — E1039 Type 1 diabetes mellitus with other diabetic ophthalmic complication: Secondary | ICD-10-CM | POA: Diagnosis not present

## 2017-07-07 DIAGNOSIS — H26491 Other secondary cataract, right eye: Secondary | ICD-10-CM | POA: Diagnosis not present

## 2017-07-07 DIAGNOSIS — H35371 Puckering of macula, right eye: Secondary | ICD-10-CM | POA: Diagnosis not present

## 2017-07-17 DIAGNOSIS — H26491 Other secondary cataract, right eye: Secondary | ICD-10-CM | POA: Diagnosis not present

## 2017-07-21 DIAGNOSIS — E108 Type 1 diabetes mellitus with unspecified complications: Secondary | ICD-10-CM | POA: Diagnosis not present

## 2017-08-22 DIAGNOSIS — Z23 Encounter for immunization: Secondary | ICD-10-CM | POA: Diagnosis not present

## 2017-09-04 DIAGNOSIS — E103591 Type 1 diabetes mellitus with proliferative diabetic retinopathy without macular edema, right eye: Secondary | ICD-10-CM | POA: Diagnosis not present

## 2017-09-04 DIAGNOSIS — H4311 Vitreous hemorrhage, right eye: Secondary | ICD-10-CM | POA: Diagnosis not present

## 2017-09-04 DIAGNOSIS — E1039 Type 1 diabetes mellitus with other diabetic ophthalmic complication: Secondary | ICD-10-CM | POA: Diagnosis not present

## 2017-09-04 DIAGNOSIS — H35371 Puckering of macula, right eye: Secondary | ICD-10-CM | POA: Diagnosis not present

## 2017-09-08 ENCOUNTER — Encounter: Payer: Self-pay | Admitting: Podiatry

## 2017-09-08 ENCOUNTER — Ambulatory Visit (INDEPENDENT_AMBULATORY_CARE_PROVIDER_SITE_OTHER): Payer: Medicare Other | Admitting: Podiatry

## 2017-09-08 ENCOUNTER — Ambulatory Visit (INDEPENDENT_AMBULATORY_CARE_PROVIDER_SITE_OTHER): Payer: Medicare Other

## 2017-09-08 VITALS — BP 147/78 | HR 91 | Resp 16

## 2017-09-08 DIAGNOSIS — M722 Plantar fascial fibromatosis: Secondary | ICD-10-CM | POA: Diagnosis not present

## 2017-09-08 MED ORDER — MELOXICAM 15 MG PO TABS: 15.0000 mg | ORAL_TABLET | Freq: Every day | ORAL | 3 refills | Status: DC

## 2017-09-08 NOTE — Progress Notes (Signed)
  Subjective:  Patient ID: Scott SchultzeJames H Kirkeby, male    DOB: 02-26-1961,  MRN: [id number] HPI Chief Complaint  Patient presents with  . Foot Pain    Plantar heel right - aching x 1 month, AM pain, no treatment    56 y.o. male presents with the above complaint.     No past medical history on file. No past surgical history on file.  Current Outpatient Prescriptions:  .  canagliflozin (INVOKANA) 100 MG TABS tablet, Take by mouth daily before breakfast., Disp: , Rfl:  .  insulin regular (NOVOLIN R,HUMULIN R) 100 units/mL injection, Inject into the skin 3 (three) times daily before meals., Disp: , Rfl:  .  pravastatin (PRAVACHOL) 80 MG tablet, Take 80 mg by mouth daily., Disp: , Rfl:  .  ramipril (ALTACE) 10 MG capsule, Take 10 mg by mouth daily., Disp: , Rfl:   No Known Allergies Review of Systems  Eyes: Positive for visual disturbance.  Musculoskeletal: Positive for arthralgias.  Neurological: Positive for numbness.  All other systems reviewed and are negative.  Objective:   Vitals:   09/08/17 1336  BP: (!) 147/78  Pulse: 91  Resp: 16    General: Well developed, nourished, in no acute distress, alert and oriented x3   Dermatological: Skin is warm, dry and supple bilateral. Nails x 10 are well maintained; remaining integument appears unremarkable at this time. There are no open sores, no preulcerative lesions, no rash or signs of infection present.  Vascular: Dorsalis Pedis artery and Posterior Tibial artery pedal pulses are 2/4 bilateral with immedate capillary fill time. Pedal hair growth present. No varicosities and no lower extremity edema present bilateral.   Neruologic: Grossly intact via light touch bilateral. Vibratory intact via tuning fork bilateral. Protective threshold with Semmes Wienstein monofilament intact to all pedal sites bilateral. Patellar and Achilles deep tendon reflexes 2+ bilateral. No Babinski or clonus noted bilateral.   Musculoskeletal: No gross boney  pedal deformities bilateral. No pain, crepitus, or limitation noted with foot and ankle range of motion bilateral. Muscular strength 5/5 in all groups tested bilateral. He has no pain on medial and lateral compression of the calcaneus however he does have pain on direct palpation medial calcaneal tubercle of the right heel. No pain on palpation of the lateral aspect of the foot or posterior aspect of the foot.  Gait: Unassisted, Nonantalgic.    Radiographs:  3 views of the right foot demonstrates a soft tissue increase in density at the plantar fascial calcaneal insertion site. No fractures are identified.  Assessment & Plan:   Assessment: Plantar fasciitis 5 in nature.    Plan: Injected the right heel today with Kenalog and local anesthetic. Started him on meloxicam. I cemented plantar fascial brace and night splint. Discussed appropriate shoe gear stretching exercises ice therapy and shoe gear modification.     Max T. SpearmanHyatt, North DakotaDPM

## 2017-09-08 NOTE — Patient Instructions (Signed)

## 2017-09-17 DIAGNOSIS — E103591 Type 1 diabetes mellitus with proliferative diabetic retinopathy without macular edema, right eye: Secondary | ICD-10-CM | POA: Diagnosis not present

## 2017-09-17 DIAGNOSIS — H4311 Vitreous hemorrhage, right eye: Secondary | ICD-10-CM | POA: Diagnosis not present

## 2017-09-17 DIAGNOSIS — E1039 Type 1 diabetes mellitus with other diabetic ophthalmic complication: Secondary | ICD-10-CM | POA: Diagnosis not present

## 2017-09-17 DIAGNOSIS — H35371 Puckering of macula, right eye: Secondary | ICD-10-CM | POA: Diagnosis not present

## 2017-10-08 ENCOUNTER — Ambulatory Visit: Payer: Medicare Other | Admitting: Podiatry

## 2017-10-29 DIAGNOSIS — E109 Type 1 diabetes mellitus without complications: Secondary | ICD-10-CM | POA: Diagnosis not present

## 2017-10-29 DIAGNOSIS — H35371 Puckering of macula, right eye: Secondary | ICD-10-CM | POA: Diagnosis not present

## 2017-10-29 DIAGNOSIS — H4311 Vitreous hemorrhage, right eye: Secondary | ICD-10-CM | POA: Diagnosis not present

## 2017-10-29 DIAGNOSIS — E1039 Type 1 diabetes mellitus with other diabetic ophthalmic complication: Secondary | ICD-10-CM | POA: Diagnosis not present

## 2017-10-29 DIAGNOSIS — E103591 Type 1 diabetes mellitus with proliferative diabetic retinopathy without macular edema, right eye: Secondary | ICD-10-CM | POA: Diagnosis not present

## 2017-11-03 ENCOUNTER — Inpatient Hospital Stay (HOSPITAL_COMMUNITY)
Admission: EM | Admit: 2017-11-03 | Discharge: 2017-11-07 | DRG: 919 | Disposition: A | Discharge status: Home / Self Care | Payer: Medicare Other | Attending: Family Medicine | Admitting: Family Medicine

## 2017-11-03 ENCOUNTER — Encounter (HOSPITAL_COMMUNITY): Payer: Self-pay

## 2017-11-03 ENCOUNTER — Other Ambulatory Visit: Payer: Self-pay

## 2017-11-03 ENCOUNTER — Inpatient Hospital Stay (HOSPITAL_COMMUNITY): Payer: Medicare Other

## 2017-11-03 DIAGNOSIS — D72829 Elevated white blood cell count, unspecified: Secondary | ICD-10-CM | POA: Diagnosis not present

## 2017-11-03 DIAGNOSIS — E878 Other disorders of electrolyte and fluid balance, not elsewhere classified: Secondary | ICD-10-CM | POA: Diagnosis present

## 2017-11-03 DIAGNOSIS — R1111 Vomiting without nausea: Secondary | ICD-10-CM | POA: Diagnosis not present

## 2017-11-03 DIAGNOSIS — R112 Nausea with vomiting, unspecified: Secondary | ICD-10-CM | POA: Diagnosis not present

## 2017-11-03 DIAGNOSIS — K760 Fatty (change of) liver, not elsewhere classified: Secondary | ICD-10-CM | POA: Diagnosis present

## 2017-11-03 DIAGNOSIS — Y742 Prosthetic and other implants, materials and accessory general hospital and personal-use devices associated with adverse incidents: Secondary | ICD-10-CM | POA: Diagnosis present

## 2017-11-03 DIAGNOSIS — R000 Tachycardia, unspecified: Secondary | ICD-10-CM | POA: Diagnosis present

## 2017-11-03 DIAGNOSIS — I4510 Unspecified right bundle-branch block: Secondary | ICD-10-CM | POA: Diagnosis not present

## 2017-11-03 DIAGNOSIS — Z794 Long term (current) use of insulin: Secondary | ICD-10-CM

## 2017-11-03 DIAGNOSIS — R17 Unspecified jaundice: Secondary | ICD-10-CM | POA: Diagnosis not present

## 2017-11-03 DIAGNOSIS — J9601 Acute respiratory failure with hypoxia: Secondary | ICD-10-CM | POA: Diagnosis not present

## 2017-11-03 DIAGNOSIS — E1110 Type 2 diabetes mellitus with ketoacidosis without coma: Secondary | ICD-10-CM

## 2017-11-03 DIAGNOSIS — I21A1 Myocardial infarction type 2: Secondary | ICD-10-CM | POA: Diagnosis not present

## 2017-11-03 DIAGNOSIS — I10 Essential (primary) hypertension: Secondary | ICD-10-CM | POA: Diagnosis present

## 2017-11-03 DIAGNOSIS — E7800 Pure hypercholesterolemia, unspecified: Secondary | ICD-10-CM | POA: Diagnosis not present

## 2017-11-03 DIAGNOSIS — E860 Dehydration: Secondary | ICD-10-CM | POA: Diagnosis not present

## 2017-11-03 DIAGNOSIS — H5462 Unqualified visual loss, left eye, normal vision right eye: Secondary | ICD-10-CM | POA: Diagnosis present

## 2017-11-03 DIAGNOSIS — R0989 Other specified symptoms and signs involving the circulatory and respiratory systems: Secondary | ICD-10-CM

## 2017-11-03 DIAGNOSIS — R9431 Abnormal electrocardiogram [ECG] [EKG]: Secondary | ICD-10-CM | POA: Diagnosis not present

## 2017-11-03 DIAGNOSIS — J181 Lobar pneumonia, unspecified organism: Secondary | ICD-10-CM | POA: Diagnosis present

## 2017-11-03 DIAGNOSIS — N179 Acute kidney failure, unspecified: Secondary | ICD-10-CM | POA: Diagnosis present

## 2017-11-03 DIAGNOSIS — Z79899 Other long term (current) drug therapy: Secondary | ICD-10-CM

## 2017-11-03 DIAGNOSIS — I480 Paroxysmal atrial fibrillation: Secondary | ICD-10-CM | POA: Diagnosis present

## 2017-11-03 DIAGNOSIS — T85694A Other mechanical complication of insulin pump, initial encounter: Secondary | ICD-10-CM | POA: Diagnosis not present

## 2017-11-03 DIAGNOSIS — E0810 Diabetes mellitus due to underlying condition with ketoacidosis without coma: Secondary | ICD-10-CM | POA: Diagnosis not present

## 2017-11-03 DIAGNOSIS — E785 Hyperlipidemia, unspecified: Secondary | ICD-10-CM | POA: Diagnosis present

## 2017-11-03 DIAGNOSIS — J189 Pneumonia, unspecified organism: Secondary | ICD-10-CM | POA: Diagnosis not present

## 2017-11-03 DIAGNOSIS — E1010 Type 1 diabetes mellitus with ketoacidosis without coma: Secondary | ICD-10-CM

## 2017-11-03 DIAGNOSIS — R918 Other nonspecific abnormal finding of lung field: Secondary | ICD-10-CM | POA: Diagnosis not present

## 2017-11-03 DIAGNOSIS — R748 Abnormal levels of other serum enzymes: Secondary | ICD-10-CM

## 2017-11-03 HISTORY — DX: Type 2 diabetes mellitus without complications: E11.9

## 2017-11-03 LAB — I-STAT VENOUS BLOOD GAS, ED
Acid-base deficit: 22 mmol/L — ABNORMAL HIGH (ref 0.0–2.0)
BICARBONATE: 6.4 mmol/L — AB (ref 20.0–28.0)
O2 Saturation: 96 %
PCO2 VEN: 22.4 mmHg — AB (ref 44.0–60.0)
TCO2: 7 mmol/L — AB (ref 22–32)
pH, Ven: 7.06 — CL (ref 7.250–7.430)
pO2, Ven: 115 mmHg — ABNORMAL HIGH (ref 32.0–45.0)

## 2017-11-03 LAB — URINALYSIS, ROUTINE W REFLEX MICROSCOPIC
Bacteria, UA: NONE SEEN
Bilirubin Urine: NEGATIVE
HGB URINE DIPSTICK: NEGATIVE
Ketones, ur: 80 mg/dL — AB
Leukocytes, UA: NEGATIVE
NITRITE: NEGATIVE
Protein, ur: NEGATIVE mg/dL
SPECIFIC GRAVITY, URINE: 1.02 (ref 1.005–1.030)
Squamous Epithelial / LPF: NONE SEEN
pH: 5 (ref 5.0–8.0)

## 2017-11-03 LAB — BASIC METABOLIC PANEL
ANION GAP: 23 — AB (ref 5–15)
ANION GAP: 15 (ref 5–15)
ANION GAP: 10 (ref 5–15)
BUN: 55 mg/dL — ABNORMAL HIGH (ref 6–20)
BUN: 57 mg/dL — ABNORMAL HIGH (ref 6–20)
BUN: 52 mg/dL — ABNORMAL HIGH (ref 6–20)
BUN: 44 mg/dL — ABNORMAL HIGH (ref 6–20)
CHLORIDE: 97 mmol/L — AB (ref 101–111)
CO2: 9 mmol/L — AB (ref 22–32)
CO2: 15 mmol/L — AB (ref 22–32)
CO2: 18 mmol/L — AB (ref 22–32)
Calcium: 8.5 mg/dL — ABNORMAL LOW (ref 8.9–10.3)
Calcium: 8.5 mg/dL — ABNORMAL LOW (ref 8.9–10.3)
Calcium: 8.6 mg/dL — ABNORMAL LOW (ref 8.9–10.3)
Calcium: 8.3 mg/dL — ABNORMAL LOW (ref 8.9–10.3)
Chloride: 101 mmol/L (ref 101–111)
Chloride: 108 mmol/L (ref 101–111)
Chloride: 106 mmol/L (ref 101–111)
Creatinine, Ser: 2.84 mg/dL — ABNORMAL HIGH (ref 0.61–1.24)
Creatinine, Ser: 2.49 mg/dL — ABNORMAL HIGH (ref 0.61–1.24)
Creatinine, Ser: 1.9 mg/dL — ABNORMAL HIGH (ref 0.61–1.24)
Creatinine, Ser: 1.43 mg/dL — ABNORMAL HIGH (ref 0.61–1.24)
GFR calc Af Amer: 27 mL/min — ABNORMAL LOW (ref >60)
GFR calc Af Amer: 31 mL/min — ABNORMAL LOW (ref >60)
GFR calc non Af Amer: 23 mL/min — ABNORMAL LOW (ref >60)
GFR calc non Af Amer: 27 mL/min — ABNORMAL LOW (ref >60)
GFR, EST AFRICAN AMERICAN: 43 mL/min — AB (ref >60)
GFR, EST NON AFRICAN AMERICAN: 37 mL/min — AB (ref >60)
GFR, EST NON AFRICAN AMERICAN: 53 mL/min — AB (ref >60)
GLUCOSE: 742 mg/dL — AB (ref 65–99)
GLUCOSE: 487 mg/dL — AB (ref 65–99)
GLUCOSE: 257 mg/dL — AB (ref 65–99)
GLUCOSE: 148 mg/dL — AB (ref 65–99)
POTASSIUM: 5.1 mmol/L (ref 3.5–5.1)
POTASSIUM: 4.2 mmol/L (ref 3.5–5.1)
POTASSIUM: 4 mmol/L (ref 3.5–5.1)
POTASSIUM: 4.3 mmol/L (ref 3.5–5.1)
Sodium: 132 mmol/L — ABNORMAL LOW (ref 135–145)
Sodium: 133 mmol/L — ABNORMAL LOW (ref 135–145)
Sodium: 138 mmol/L (ref 135–145)
Sodium: 134 mmol/L — ABNORMAL LOW (ref 135–145)

## 2017-11-03 LAB — HEMOGLOBIN A1C
Hgb A1c MFr Bld: 7.7 % — ABNORMAL HIGH (ref 4.8–5.6)
Mean Plasma Glucose: 174.29 mg/dL

## 2017-11-03 LAB — COMPREHENSIVE METABOLIC PANEL
ALBUMIN: 4.5 g/dL (ref 3.5–5.0)
ALK PHOS: 98 U/L (ref 38–126)
ALT: 22 U/L (ref 17–63)
AST: 31 U/L (ref 15–41)
Anion gap: 34 — ABNORMAL HIGH (ref 5–15)
BILIRUBIN TOTAL: 3.4 mg/dL — AB (ref 0.3–1.2)
BUN: 51 mg/dL — AB (ref 6–20)
CALCIUM: 9.4 mg/dL (ref 8.9–10.3)
CO2: 7 mmol/L — ABNORMAL LOW (ref 22–32)
Chloride: 91 mmol/L — ABNORMAL LOW (ref 101–111)
Creatinine, Ser: 2.79 mg/dL — ABNORMAL HIGH (ref 0.61–1.24)
GFR calc Af Amer: 27 mL/min — ABNORMAL LOW (ref >60)
GFR calc non Af Amer: 23 mL/min — ABNORMAL LOW (ref >60)
GLUCOSE: 756 mg/dL — AB (ref 65–99)
Potassium: 5.6 mmol/L — ABNORMAL HIGH (ref 3.5–5.1)
Sodium: 132 mmol/L — ABNORMAL LOW (ref 135–145)
TOTAL PROTEIN: 7.3 g/dL (ref 6.5–8.1)

## 2017-11-03 LAB — CBC
HCT: 45.7 % (ref 39.0–52.0)
Hemoglobin: 14.7 g/dL (ref 13.0–17.0)
MCH: 30 pg (ref 26.0–34.0)
MCHC: 32.2 g/dL (ref 30.0–36.0)
MCV: 93.3 fL (ref 78.0–100.0)
Platelets: 229 10*3/uL (ref 150–400)
RBC: 4.9 MIL/uL (ref 4.22–5.81)
RDW: 13.3 % (ref 11.5–15.5)
WBC: 21.7 10*3/uL — ABNORMAL HIGH (ref 4.0–10.5)

## 2017-11-03 LAB — GLUCOSE, CAPILLARY
GLUCOSE-CAPILLARY: 138 mg/dL — AB (ref 65–99)
GLUCOSE-CAPILLARY: 138 mg/dL — AB (ref 65–99)
GLUCOSE-CAPILLARY: 335 mg/dL — AB (ref 65–99)
Glucose-Capillary: 545 mg/dL (ref 65–99)
Glucose-Capillary: 391 mg/dL — ABNORMAL HIGH (ref 65–99)
Glucose-Capillary: 249 mg/dL — ABNORMAL HIGH (ref 65–99)
Glucose-Capillary: 156 mg/dL — ABNORMAL HIGH (ref 65–99)
Glucose-Capillary: 115 mg/dL — ABNORMAL HIGH (ref 65–99)
Glucose-Capillary: 150 mg/dL — ABNORMAL HIGH (ref 65–99)
Glucose-Capillary: 150 mg/dL — ABNORMAL HIGH (ref 65–99)
Glucose-Capillary: 138 mg/dL — ABNORMAL HIGH (ref 65–99)

## 2017-11-03 LAB — I-STAT TROPONIN, ED: TROPONIN I, POC: 0.02 ng/mL (ref 0.00–0.08)

## 2017-11-03 LAB — TROPONIN I
TROPONIN I: 0.6 ng/mL — AB (ref <0.03)
TROPONIN I: 2.34 ng/mL — AB (ref <0.03)

## 2017-11-03 LAB — MAGNESIUM: Magnesium: 2.6 mg/dL — ABNORMAL HIGH (ref 1.7–2.4)

## 2017-11-03 LAB — CBG MONITORING, ED: Glucose-Capillary: >600 mg/dL (ref 65–99)

## 2017-11-03 LAB — BILIRUBIN, FRACTIONATED(TOT/DIR/INDIR)
Bilirubin, Direct: <0.1 mg/dL — ABNORMAL LOW (ref 0.1–0.5)
Total Bilirubin: 2.3 mg/dL — ABNORMAL HIGH (ref 0.3–1.2)

## 2017-11-03 LAB — LACTIC ACID, PLASMA
LACTIC ACID, VENOUS: 4.5 mmol/L — AB (ref 0.5–1.9)
Lactic Acid, Venous: 2.4 mmol/L (ref 0.5–1.9)

## 2017-11-03 LAB — LIPASE, BLOOD: Lipase: 168 U/L — ABNORMAL HIGH (ref 11–51)

## 2017-11-03 MED ORDER — SODIUM CHLORIDE 0.9 % IV SOLN
INTRAVENOUS | Status: DC
  Administered 2017-11-03: 5.8 [IU]/h via INTRAVENOUS
  Administered 2017-11-03: 10.8 [IU]/h via INTRAVENOUS
  Administered 2017-11-03: 16.5 [IU]/h via INTRAVENOUS
  Administered 2017-11-03: 16.6 [IU]/h via INTRAVENOUS
  Administered 2017-11-03: 16.2 [IU]/h via INTRAVENOUS
  Administered 2017-11-03: 11.3 [IU]/h via INTRAVENOUS
  Administered 2017-11-03: 21.6 [IU]/h via INTRAVENOUS
  Administered 2017-11-03: 24.3 [IU]/h via INTRAVENOUS
  Administered 2017-11-04: 2 [IU]/h via INTRAVENOUS
  Filled 2017-11-03 (×2): qty 1

## 2017-11-03 MED ORDER — HYDROCODONE-ACETAMINOPHEN 5-325 MG PO TABS
1.0000 | ORAL_TABLET | ORAL | Status: DC | PRN
Start: 2017-11-03 — End: 2017-11-07

## 2017-11-03 MED ORDER — DEXTROSE-NACL 5-0.45 % IV SOLN
INTRAVENOUS | Status: DC
  Administered 2017-11-03: 19:00:00 via INTRAVENOUS

## 2017-11-03 MED ORDER — METOCLOPRAMIDE HCL 5 MG/ML IJ SOLN
10.0000 mg | Freq: Four times a day (QID) | INTRAMUSCULAR | Status: DC | PRN
  Administered 2017-11-05: 10 mg via INTRAVENOUS
  Filled 2017-11-03: qty 2

## 2017-11-03 MED ORDER — RAMIPRIL 10 MG PO CAPS
10.0000 mg | ORAL_CAPSULE | Freq: Every day | ORAL | Status: DC
  Administered 2017-11-04: 10 mg via ORAL
  Filled 2017-11-03: qty 1

## 2017-11-03 MED ORDER — ONDANSETRON HCL 4 MG PO TABS: 4.0000 mg | ORAL_TABLET | Freq: Four times a day (QID) | ORAL | Status: DC | PRN

## 2017-11-03 MED ORDER — BISACODYL 10 MG RE SUPP: 10.0000 mg | Freq: Every day | RECTAL | Status: DC | PRN

## 2017-11-03 MED ORDER — ONDANSETRON HCL 4 MG/2ML IJ SOLN: 4.0000 mg | Freq: Four times a day (QID) | INTRAMUSCULAR | Status: DC | PRN

## 2017-11-03 MED ORDER — HEPARIN SODIUM (PORCINE) 5000 UNIT/ML IJ SOLN
5000.0000 [IU] | Freq: Three times a day (TID) | INTRAMUSCULAR | Status: DC
  Administered 2017-11-03 (×2): 5000 [IU] via SUBCUTANEOUS
  Filled 2017-11-03 (×2): qty 1

## 2017-11-03 MED ORDER — SODIUM CHLORIDE 0.9 % IV SOLN
INTRAVENOUS | Status: DC
  Administered 2017-11-03 (×2): via INTRAVENOUS

## 2017-11-03 MED ORDER — SODIUM CHLORIDE 0.9 % IV BOLUS (SEPSIS)
2000.0000 mL | Freq: Once | INTRAVENOUS | Status: AC
  Administered 2017-11-03: 2000 mL via INTRAVENOUS

## 2017-11-03 MED ORDER — SODIUM CHLORIDE 0.9 % IV SOLN
INTRAVENOUS | Status: AC
  Administered 2017-11-03: 11:00:00 via INTRAVENOUS

## 2017-11-03 MED ORDER — PANTOPRAZOLE SODIUM 40 MG IV SOLR
40.0000 mg | INTRAVENOUS | Status: DC
  Administered 2017-11-03 – 2017-11-06 (×4): 40 mg via INTRAVENOUS
  Filled 2017-11-03 (×4): qty 40

## 2017-11-03 MED ORDER — IOPAMIDOL (ISOVUE-300) INJECTION 61%
INTRAVENOUS | Status: AC
  Filled 2017-11-03: qty 30

## 2017-11-03 MED ORDER — SENNOSIDES-DOCUSATE SODIUM 8.6-50 MG PO TABS
1.0000 | ORAL_TABLET | Freq: Every evening | ORAL | Status: DC | PRN
  Administered 2017-11-07: 1 via ORAL
  Filled 2017-11-03: qty 1

## 2017-11-03 MED ORDER — SODIUM CHLORIDE 0.9 % IV SOLN
INTRAVENOUS | Status: DC
  Administered 2017-11-03: 5.4 [IU]/h via INTRAVENOUS
  Filled 2017-11-03: qty 1

## 2017-11-03 MED ORDER — METOCLOPRAMIDE HCL 5 MG/ML IJ SOLN
10.0000 mg | Freq: Once | INTRAMUSCULAR | Status: AC
  Administered 2017-11-03: 10 mg via INTRAVENOUS
  Filled 2017-11-03: qty 2

## 2017-11-03 MED ORDER — ACETAMINOPHEN 650 MG RE SUPP: 650.0000 mg | Freq: Four times a day (QID) | RECTAL | Status: DC | PRN

## 2017-11-03 MED ORDER — SODIUM CHLORIDE 0.9 % IV SOLN
INTRAVENOUS | Status: DC
  Administered 2017-11-03: 09:00:00 via INTRAVENOUS

## 2017-11-03 MED ORDER — MORPHINE SULFATE (PF) 4 MG/ML IV SOLN: 1.0000 mg | INTRAVENOUS | Status: DC | PRN

## 2017-11-03 MED ORDER — PRAVASTATIN SODIUM 40 MG PO TABS
80.0000 mg | ORAL_TABLET | Freq: Every day | ORAL | Status: DC
  Administered 2017-11-04 – 2017-11-07 (×4): 80 mg via ORAL
  Filled 2017-11-03 (×4): qty 2

## 2017-11-03 MED ORDER — ACETAMINOPHEN 325 MG PO TABS
650.0000 mg | ORAL_TABLET | Freq: Four times a day (QID) | ORAL | Status: DC | PRN
  Administered 2017-11-05: 650 mg via ORAL
  Filled 2017-11-03: qty 2

## 2017-11-03 MED ORDER — DEXTROSE-NACL 5-0.45 % IV SOLN: INTRAVENOUS | Status: DC

## 2017-11-03 MED ORDER — ASPIRIN EC 81 MG PO TBEC
81.0000 mg | DELAYED_RELEASE_TABLET | Freq: Every day | ORAL | Status: DC
  Administered 2017-11-04 – 2017-11-07 (×4): 81 mg via ORAL
  Filled 2017-11-03 (×4): qty 1

## 2017-11-03 NOTE — ED Notes (Signed)
Lab results given to Dr. J. 

## 2017-11-03 NOTE — ED Provider Notes (Signed)
[facility] Note   CSN: [id number] Arrival date & time: 11/03/17  [id number]     History   Chief Complaint Chief Complaint  Patient presents with  . Emesis    HPI Scott Mosley is a 56 y.o. male.  Complains of vomiting multiple times onset noon yesterday.  He denies headache denies chest pain denies abdominal pain other associated symptoms include generalized malaise.  Denies fever denies any diarrhea.  Denies noncompliance with  medications.  No other associated symptoms.  No treatment prior to coming here.  Nothing makes symptoms better or worse  HPI  History reviewed. No pertinent past medical history.  Patient Active Problem List   Diagnosis Date Noted  . DIABETES MELLITUS 01/29/2010    History reviewed. No pertinent surgical history.     Home Medications    Prior to Admission medications   Medication Sig Start Date End Date Taking? Authorizing Provider  canagliflozin (INVOKANA) 100 MG TABS tablet Take by mouth daily before breakfast.    [provider]  insulin regular (NOVOLIN R,HUMULIN R) 100 units/mL injection Inject into the skin 3 (three) times daily before meals.    [provider]  meloxicam (MOBIC) 15 MG tablet Take 1 tablet (15 mg total) by mouth daily. 09/08/17   Hyatt, Max T, DPM  pravastatin (PRAVACHOL) 80 MG tablet Take 80 mg by mouth daily.    [provider]  ramipril (ALTACE) 10 MG capsule Take 10 mg by mouth daily.    [provider]    Family History History reviewed. No pertinent family history.  Social History Social History   Tobacco Use  . Smoking status: Never Smoker  . Smokeless tobacco: Never Used  Substance Use Topics  . Alcohol use: No    Alcohol/week: 0.0 oz  . Drug use: No     Allergies   Patient has no known allergies.   Review of Systems Review of Systems  Constitutional: Negative.        Generalized malaise  HENT: Negative.   Eyes:  Positive for visual disturbance.       Blind in 1 eye  Respiratory: Negative.   Cardiovascular: Positive for chest pain.       Syncope  Gastrointestinal: Positive for nausea and vomiting.  Musculoskeletal: Negative.   Skin: Negative.   Allergic/Immunologic: Positive for immunocompromised state.       Diabetic  Neurological: Negative.   Psychiatric/Behavioral: Negative.   All other systems reviewed and are negative.    Physical Exam Updated Vital Signs BP (!) 113/46 (BP Location: Left Arm)   Pulse (!) 115   Temp 97.9 F (36.6 C) (Oral)   Resp (!) 22   Ht 5\' 8"  (1.727 m)   Wt 88.5 kg (195 lb)   SpO2 100%   BMI 29.65 kg/m   Physical Exam  Constitutional: He appears well-developed and well-nourished.  Moderately ill-appearing  HENT:  Head: Normocephalic and atraumatic.  Mucous membranes dry  Eyes: Conjunctivae and EOM are normal.  Right cornea opacified  Neck: Neck supple. No tracheal deviation present. No thyromegaly present.  Cardiovascular: Regular rhythm.  No murmur heard. Tachycardic  Pulmonary/Chest: Effort normal and breath sounds normal.  Abdominal: Soft. Bowel sounds are normal. He exhibits no distension. There is no tenderness.  Genitourinary: Penis normal.  Musculoskeletal: Normal range of motion. He exhibits no edema or tenderness.  Neurological: He is alert. Coordination normal.  Skin: Skin is warm and dry. No rash noted.  Psychiatric: He has a normal mood and affect.  Nursing note and vitals reviewed.    ED Treatments / Results  Labs (all labs ordered are listed, but only abnormal results are displayed) Labs Reviewed  CBG MONITORING, ED - Abnormal; Notable for the following components:      Result Value   Glucose-Capillary >600 (*)    All other components within normal limits  LIPASE, BLOOD  COMPREHENSIVE METABOLIC PANEL  CBC  URINALYSIS, ROUTINE W REFLEX MICROSCOPIC  BLOOD GAS, VENOUS  CBG MONITORING, ED  I-STAT TROPONIN, ED    EKG  EKG  Interpretation  Date/Time:  Tuesday November 03 2017 07:34:07 EST Ventricular Rate:  118 PR Interval:    QRS Duration: 158 QT Interval:  476 QTC Calculation: 668 R Axis:   90 Text Interpretation:  Sinus tachycardia RBBB and LPFB Baseline wander in lead(s) V2 V5 Right bundle branch block New since previous tracing Confirmed by Doug SouJacubowitz, Becka Lagasse [id number]) on 11/03/2017 7:44:35 AM       Radiology No results found.  Procedures Procedures (including critical care time)  Medications Ordered in ED Medications  sodium chloride 0.9 % bolus 2,000 mL (not administered)  metoCLOPramide (REGLAN) injection 10 mg (not administered)   9 AM patient resting comfortably after treatment with intravenous fluids and intravenous antiemetics.  Intravenous insulin drip ordered after complete metabolic panel resulted ts insulin pump was turned off Results for orders placed or performed during the hospital encounter of 11/03/17  Lipase, blood  Result Value Ref Range   Lipase 168 (H) 11 - 51 U/L  Comprehensive metabolic panel  Result Value Ref Range   Sodium 132 (L) 135 - 145 mmol/L   Potassium 5.6 (H) 3.5 - 5.1 mmol/L   Chloride 91 (L) 101 - 111 mmol/L   CO2 7 (L) 22 - 32 mmol/L   Glucose, Bld 756 (HH) 65 - 99 mg/dL   BUN 51 (H) 6 - 20 mg/dL   Creatinine, Ser 6.042.79 (H) 0.61 - 1.24 mg/dL   Calcium 9.4 8.9 - 54.010.3 mg/dL   Total Protein 7.3 6.5 - 8.1 g/dL   Albumin 4.5 3.5 - 5.0 g/dL   AST 31 15 - 41 U/L   ALT 22 17 - 63 U/L   Alkaline Phosphatase 98 38 - 126 U/L   Total Bilirubin 3.4 (H) 0.3 - 1.2 mg/dL   GFR calc non Af Amer 23 (L) >60 mL/min   GFR calc Af Amer 27 (L) >60 mL/min   Anion gap 34 (H) 5 - 15  CBC  Result Value Ref Range   WBC 21.7 (H) 4.0 - 10.5 K/uL   RBC 4.90 4.22 - 5.81 MIL/uL   Hemoglobin 14.7 13.0 - 17.0 g/dL   HCT 98.145.7 19.139.0 - 47.852.0 %   MCV 93.3 78.0 - 100.0 fL   MCH 30.0 26.0 - 34.0 pg   MCHC 32.2 30.0 - 36.0 g/dL   RDW 29.513.3 62.111.5 - 30.815.5 %   Platelets 229 150 - 400 K/uL    CBG monitoring, ED  Result Value Ref Range   Glucose-Capillary >600 (HH) 65 - 99 mg/dL   Comment 1 Notify RN    Comment 2 Document in Chart   I-stat troponin, ED  Result Value Ref Range   Troponin i, poc 0.02 0.00 - 0.08 ng/mL   Comment 3          I-Stat Venous Blood Gas, ED (order at [facility] and [facility] only)  Result Value Ref Range   pH, Ven 7.060 (  LL) 7.250 - 7.430   pCO2, Ven 22.4 (L) 44.0 - 60.0 mmHg   pO2, Ven 115.0 (H) 32.0 - 45.0 mmHg   Bicarbonate 6.4 (L) 20.0 - 28.0 mmol/L   TCO2 7 (L) 22 - 32 mmol/L   O2 Saturation 96.0 %   Acid-base deficit 22.0 (H) 0.0 - 2.0 mmol/L   Patient temperature HIDE    Sample type VENOUS    Comment NOTIFIED PHYSICIAN    No results found. Initial Impression / Assessment and Plan / ED Course  I have reviewed the triage vital signs and the nursing notes.  Pertinent labs & imaging results that were available during my care of the patient were reviewed by me and considered in my medical decision making (see chart for details).     I consulted the hospitalist service who will see patient in the emergency department and arrange for admission to stepdown unit  Final Clinical Impressions(s) / ED Diagnoses  Diagnoses #1 diabetic ketoacidosis #2 dehydration  #3 acute kidney injury Final diagnoses:  None   CRITICAL CARE Performed by: Doug Sou Total critical care time: 45 minutes Critical care time was exclusive of separately billable procedures and treating other patients. Critical care was necessary to treat or prevent imminent or life-threatening deterioration. Critical care was time spent personally by me on the following activities: development of treatment plan with patient and/or surrogate as well as nursing, discussions with consultants, evaluation of patient's response to treatment, examination of patient, obtaining history from patient or surrogate, ordering and performing treatments and interventions, ordering and review of laboratory  studies, ordering and review of radiographic studies, pulse oximetry and re-evaluation of patient's condition. ED Discharge Orders    None       Doug Sou, MD 11/03/17 [phone]

## 2017-11-03 NOTE — ED Notes (Addendum)
Notified pt of need for urine sample, pt unable to provide urine at this time.

## 2017-11-03 NOTE — Progress Notes (Signed)
[facility] Pt oriented to room and staff. Pt on insulin drip. Wife at bedside. Vitals obtained. CHG bath completed. Pt denies needs at this time. Will continue current plan of care.   Berdine DanceLauren Moffitt BSN, RN

## 2017-11-03 NOTE — H&P (Signed)
History and Physical    Scott SchultzeJames H Ducharme ZOX:[id number] DOB: 13-Mar-1961 DOA: 11/03/2017   PCP: Patient, No Pcp Per   Patient coming from:  Home    Chief Complaint: Nausea and vomiting  HPI: Scott Mosley is a 56 y.o. male with medical history significant for diabetes type 1, left eye blindness, hyperlipidemia, who presented with 1 day history of multiple episodes of nonbilious, nonbloody vomiting.  While hunting, the patient began to experience nausea, "not feeling well ", weak, but without vertigo, syncope or presyncope.  He denies any chest pain or palpitations.  He denies any shortness of breath.  The patient denies any abdominal pain, diarrhea or constipation, last bowel movement yesterday.  Over the last 24 hours, he has decreased urine output as well.  He denies any history of UTI.  He denies any leg swelling or unilateral weakness.  He denies any tick bites, or other animal bites while hunting.  He reports that this is the first time that he feels this ill.  He is compliant with his insulin.  Of note, it is unclear why he is also on oral anti-glycemic.  He denies any tobacco, alcohol, recreational drug use.  He denies any recent long distance trips.  He denies any recent infections.  He denies any food poisoning.  He eats healthy foods.  He never had a GI evaluation, or endoscopy.  ED Course:  BP (!) 106/33   Pulse (!) 117   Temp 97.9 F (36.6 C) (Oral)   Resp 16   Ht 5\' 8"  (1.727 m)   Wt 88.5 kg (195 lb)   SpO2 100%   BMI 29.65 kg/m   On presentation, glucose value was 756.  Anion gap was 34 he was placed on insulin drip, and received genitals hydration At this time, his vomiting has resolved, but continues to feel ill EKG sinus tachycardia with right bundle branch block, unclear if this is new Lipase 168 Sodium 132, potassium 5.6, bicarb 7 BUN 51, creatinine 2.79 Calcium 9.4 AST, ALT, and alkaline phosphatase normal Total bilirubin 3.4 WBC 21.7 Hemoglobin 14.7, platelets  229  review of Systems:  As per HPI otherwise all other systems reviewed and are negative  Past Medical History:  Diagnosis Date  . Diabetes mellitus without complication (HCC)     History reviewed. No pertinent surgical history.  Social History Social History   Socioeconomic History  . Marital status: Married    Spouse name: Not on file  . Number of children: Not on file  . Years of education: Not on file  . Highest education level: Not on file  Social Needs  . Financial resource strain: Not on file  . Food insecurity - worry: Not on file  . Food insecurity - inability: Not on file  . Transportation needs - medical: Not on file  . Transportation needs - non-medical: Not on file  Occupational History  . Not on file  Tobacco Use  . Smoking status: Never Smoker  . Smokeless tobacco: Never Used  Substance and Sexual Activity  . Alcohol use: No    Alcohol/week: 0.0 oz  . Drug use: No  . Sexual activity: No  Other Topics Concern  . Not on file  Social History Narrative  . Not on file     No Known Allergies  History reviewed. No pertinent family history.    Prior to Admission medications   Medication Sig Start Date End Date Taking? Authorizing Provider  aspirin EC 81 MG  tablet Take 81 mg by mouth daily.   Yes [provider]  canagliflozin (INVOKANA) 100 MG TABS tablet Take by mouth daily before breakfast.   Yes [provider]  insulin lispro (HUMALOG) 100 UNIT/ML injection Inject into the skin 3 (three) times daily before meals. Cont- insulin pump   Yes [provider]  pravastatin (PRAVACHOL) 80 MG tablet Take 80 mg by mouth daily.   Yes [provider]  ramipril (ALTACE) 10 MG capsule Take 10 mg by mouth daily.   Yes [provider]  meloxicam (MOBIC) 15 MG tablet Take 1 tablet (15 mg total) by mouth daily. Patient not taking: Reported on 11/03/2017 09/08/17   Elinor ParkinsonHyatt, Max T, [place]    Physical Exam:  Vitals:    11/03/17 0730 11/03/17 0800 11/03/17 0900 11/03/17 0930  BP: (!) 116/38 (!) 125/36 (!) 108/36 (!) 106/33  Pulse: (!) 114 (!) 120 (!) 116 (!) 117  Resp: (!) 22 (!) 26 (!) 27 16  Temp:      TempSrc:      SpO2: 100% 100% 100% 100%  Weight:      Height:       Constitutional: NAD, but ill-appearing Eyes: PERRL, lids and conjunctivae normal on the right, on the left he is blind ENMT: Mucous membranes are moist, without exudate or lesions  Neck: normal, supple, no masses, no thyromegaly Respiratory: clear to auscultation bilaterally, no wheezing, no crackles. Normal respiratory effort  Cardiovascular: Regular rate and rhythm,  murmur, rubs or gallops. No extremity edema. 2+ pedal pulses. No carotid bruits.  Abdomen: Soft, non tender, No hepatosplenomegaly. Bowel sounds positive.  Musculoskeletal: no clubbing / cyanosis. Moves all extremities Skin: no jaundice, No lesions.  Neurologic: Sensation intact  Strength equal in all extremities.  Alert and oriented x3     Labs on Admission: I have personally reviewed following labs and imaging studies  CBC: Recent Labs  Lab 11/03/17 0724  WBC 21.7*  HGB 14.7  HCT 45.7  MCV 93.3  PLT 229    Basic Metabolic Panel: Recent Labs  Lab 11/03/17 0724  NA 132*  K 5.6*  CL 91*  CO2 7*  GLUCOSE 756*  BUN 51*  CREATININE 2.79*  CALCIUM 9.4    GFR: Estimated Creatinine Clearance: 31.2 mL/min (A) (by C-G formula based on SCr of 2.79 mg/dL (H)).  Liver Function Tests: Recent Labs  Lab 11/03/17 0724  AST 31  ALT 22  ALKPHOS 98  BILITOT 3.4*  PROT 7.3  ALBUMIN 4.5   Recent Labs  Lab 11/03/17 0724  LIPASE 168*   No results for input(s): AMMONIA in the last 168 hours.  Coagulation Profile: No results for input(s): INR, PROTIME in the last 168 hours.  Cardiac Enzymes: No results for input(s): CKTOTAL, CKMB, CKMBINDEX, TROPONINI in the last 168 hours.  BNP (last 3 results) No results for input(s): PROBNP in the last 8760  hours.  HbA1C: No results for input(s): HGBA1C in the last 72 hours.  CBG: Recent Labs  Lab 11/03/17 0724  GLUCAP >600*    Lipid Profile: No results for input(s): CHOL, HDL, LDLCALC, TRIG, CHOLHDL, LDLDIRECT in the last 72 hours.  Thyroid Function Tests: No results for input(s): TSH, T4TOTAL, FREET4, T3FREE, THYROIDAB in the last 72 hours.  Anemia Panel: No results for input(s): VITAMINB12, FOLATE, FERRITIN, TIBC, IRON, RETICCTPCT in the last 72 hours.  Urine analysis: No results found for: COLORURINE, APPEARANCEUR, LABSPEC, PHURINE, GLUCOSEU, HGBUR, BILIRUBINUR, KETONESUR, PROTEINUR, UROBILINOGEN, NITRITE, LEUKOCYTESUR  Sepsis  Labs: @LABRCNTIP (procalcitonin:4,lacticidven:4) )No results found for this or any previous visit (from the past 240 hour(s)).   Radiological Exams on Admission: No results found.  EKG: Independently reviewed.  Assessment/Plan Active Problems:   DKA (diabetic ketoacidoses) (HCC)   Hyperlipemia   Leukocytosis   Nausea and vomiting      Diabetic Ketoacidosis in a patient with ?Diabetes type 1 (he is on ?oral hypoglycemic)  Unknown etiology, rule out infection   Admission Glucose was 756 , Anion Gap 34  Bicarb 7   All other workup pending. Received 2  l IV NS, now at 125 cc/h   Insulin drip initiated .WBC 21. Afebrile. VSS  Admit for SDU DKA order set  Hold oral hypoglycemics UA with cultures  lactic acid   NPO for now  Check serial BMET q 6 h to monitor electrolytes  K is 5.6 , will monitor as it decreases value and will replenish accordingly    Nausea and vomiting, currently controlled with Reglan . Lipase 168, bili 3.4 in the setting of volume loss AST, ALT, and alkaline phosphatase normal  No abdominal pain. No history of GI illnesses   Admit to telemetry Bowel rest  continue Reglan  IV Protonix daily   IV fluids  CT abdomen due to abnormal lipase and bili  Fractionated bilirubin  Advance diet as tolerated    Acute Kidney Injury  likely due to volume loss. Decreased UO noted.  No confusion.  BL Cr  Unknown , current 2.79  Lab Results  Component Value Date   CREATININE 2.79 (H) 11/03/2017  IVF CMET in am  Hold Ace inhibitors   bladder scan to determine if patient is having urine retention, if so, will order catheter and further workup   Hyperlipidemia Continue home statins   Abnormal EKG   sinus tachycardia with right bundle branch block, unclear if this is new Repeat EKG to follow,will consider further workup including echo      DVT prophylaxis:  Heparin  Code Status:    Full  Family Communication:  Discussed with patient Disposition Plan: Expect patient to be discharged to home after condition improves Consults called:    None  Admission status: SDU tele   Marlowe Kays, PA-C [facility]   11/03/2017, 10:04 AM

## 2017-11-03 NOTE — ED Notes (Signed)
Pt presents with insulin pump, turned off by pt per MD

## 2017-11-03 NOTE — Progress Notes (Signed)
Pt's glucose remains over 600. Insulin drip running 21.6 mL/hr. MD paged to notify. No new orders. Will continue to monitor.   Berdine DanceLauren Moffitt BSN, RN

## 2017-11-03 NOTE — ED Triage Notes (Signed)
Pt arrives to ED from home with complaints of n/v since yesterday.Pt reports "it got really bad yesterday and kept on all night long"". Pt placed in position of comfort with bed locked and lowered, call bell in reach.

## 2017-11-03 NOTE — Progress Notes (Addendum)
Pt's Troponin came back 2.34 up from 0.60 paged K schorr stat EKG will notify of results Paged EKG results to K Schorr awaiting new orders

## 2017-11-04 ENCOUNTER — Inpatient Hospital Stay (HOSPITAL_COMMUNITY): Payer: Medicare Other

## 2017-11-04 DIAGNOSIS — R9431 Abnormal electrocardiogram [ECG] [EKG]: Secondary | ICD-10-CM

## 2017-11-04 DIAGNOSIS — E1010 Type 1 diabetes mellitus with ketoacidosis without coma: Secondary | ICD-10-CM

## 2017-11-04 DIAGNOSIS — R748 Abnormal levels of other serum enzymes: Secondary | ICD-10-CM

## 2017-11-04 DIAGNOSIS — I4510 Unspecified right bundle-branch block: Secondary | ICD-10-CM

## 2017-11-04 DIAGNOSIS — E7800 Pure hypercholesterolemia, unspecified: Secondary | ICD-10-CM

## 2017-11-04 LAB — INFLUENZA PANEL BY PCR (TYPE A & B)
INFLAPCR: NEGATIVE
Influenza B By PCR: NEGATIVE

## 2017-11-04 LAB — COMPREHENSIVE METABOLIC PANEL
ALK PHOS: 71 U/L (ref 38–126)
ALT: 19 U/L (ref 17–63)
AST: 27 U/L (ref 15–41)
Albumin: 3.3 g/dL — ABNORMAL LOW (ref 3.5–5.0)
Anion gap: 12 (ref 5–15)
BUN: 39 mg/dL — ABNORMAL HIGH (ref 6–20)
CALCIUM: 8.4 mg/dL — AB (ref 8.9–10.3)
CO2: 17 mmol/L — AB (ref 22–32)
CREATININE: 1.25 mg/dL — AB (ref 0.61–1.24)
Chloride: 110 mmol/L (ref 101–111)
GFR calc non Af Amer: >60 mL/min (ref >60)
Glucose, Bld: 152 mg/dL — ABNORMAL HIGH (ref 65–99)
Potassium: 3.9 mmol/L (ref 3.5–5.1)
SODIUM: 139 mmol/L (ref 135–145)
Total Bilirubin: 1.1 mg/dL (ref 0.3–1.2)
Total Protein: 5.5 g/dL — ABNORMAL LOW (ref 6.5–8.1)

## 2017-11-04 LAB — GLUCOSE, CAPILLARY
GLUCOSE-CAPILLARY: 151 mg/dL — AB (ref 65–99)
GLUCOSE-CAPILLARY: 149 mg/dL — AB (ref 65–99)
GLUCOSE-CAPILLARY: 138 mg/dL — AB (ref 65–99)
GLUCOSE-CAPILLARY: 169 mg/dL — AB (ref 65–99)
GLUCOSE-CAPILLARY: 169 mg/dL — AB (ref 65–99)
GLUCOSE-CAPILLARY: 185 mg/dL — AB (ref 65–99)
GLUCOSE-CAPILLARY: 139 mg/dL — AB (ref 65–99)
GLUCOSE-CAPILLARY: 213 mg/dL — AB (ref 65–99)
GLUCOSE-CAPILLARY: 126 mg/dL — AB (ref 65–99)
Glucose-Capillary: 114 mg/dL — ABNORMAL HIGH (ref 65–99)
Glucose-Capillary: 125 mg/dL — ABNORMAL HIGH (ref 65–99)
Glucose-Capillary: 142 mg/dL — ABNORMAL HIGH (ref 65–99)
Glucose-Capillary: 149 mg/dL — ABNORMAL HIGH (ref 65–99)
Glucose-Capillary: 159 mg/dL — ABNORMAL HIGH (ref 65–99)
Glucose-Capillary: 163 mg/dL — ABNORMAL HIGH (ref 65–99)
Glucose-Capillary: 165 mg/dL — ABNORMAL HIGH (ref 65–99)

## 2017-11-04 LAB — BASIC METABOLIC PANEL
ANION GAP: 13 (ref 5–15)
BUN: 30 mg/dL — ABNORMAL HIGH (ref 6–20)
CALCIUM: 8.2 mg/dL — AB (ref 8.9–10.3)
CO2: 14 mmol/L — AB (ref 22–32)
Chloride: 112 mmol/L — ABNORMAL HIGH (ref 101–111)
Creatinine, Ser: 1.12 mg/dL (ref 0.61–1.24)
Glucose, Bld: 233 mg/dL — ABNORMAL HIGH (ref 65–99)
Potassium: 3.9 mmol/L (ref 3.5–5.1)
SODIUM: 139 mmol/L (ref 135–145)

## 2017-11-04 LAB — TROPONIN I
Troponin I: 1.83 ng/mL (ref <0.03)
Troponin I: 1.05 ng/mL (ref <0.03)
Troponin I: 0.71 ng/mL (ref <0.03)

## 2017-11-04 LAB — MAGNESIUM: MAGNESIUM: 2.2 mg/dL (ref 1.7–2.4)

## 2017-11-04 LAB — TSH: TSH: 0.824 u[IU]/mL (ref 0.350–4.500)

## 2017-11-04 LAB — ECHOCARDIOGRAM COMPLETE
HEIGHTINCHES: 68 in
WEIGHTICAEL: 3079.39 [oz_av]

## 2017-11-04 LAB — CBC
HEMATOCRIT: 39.2 % (ref 39.0–52.0)
Hemoglobin: 13.6 g/dL (ref 13.0–17.0)
MCH: 29.8 pg (ref 26.0–34.0)
MCHC: 34.7 g/dL (ref 30.0–36.0)
MCV: 85.8 fL (ref 78.0–100.0)
PLATELETS: 185 10*3/uL (ref 150–400)
RBC: 4.57 MIL/uL (ref 4.22–5.81)
RDW: 13 % (ref 11.5–15.5)
WBC: 11.9 10*3/uL — ABNORMAL HIGH (ref 4.0–10.5)

## 2017-11-04 LAB — PROTIME-INR
INR: 1.26
Prothrombin Time: 15.7 seconds — ABNORMAL HIGH (ref 11.4–15.2)

## 2017-11-04 LAB — HEPARIN LEVEL (UNFRACTIONATED): HEPARIN UNFRACTIONATED: 0.85 [IU]/mL — AB (ref 0.30–0.70)

## 2017-11-04 LAB — HIV ANTIBODY (ROUTINE TESTING W REFLEX): HIV Screen 4th Generation wRfx: NONREACTIVE

## 2017-11-04 LAB — T4, FREE: FREE T4: 1.31 ng/dL — AB (ref 0.61–1.12)

## 2017-11-04 MED ORDER — AMIODARONE LOAD VIA INFUSION
150.0000 mg | Freq: Once | INTRAVENOUS | Status: DC
  Filled 2017-11-04: qty 83.34

## 2017-11-04 MED ORDER — LEVALBUTEROL HCL 0.63 MG/3ML IN NEBU: 0.6300 mg | INHALATION_SOLUTION | Freq: Four times a day (QID) | RESPIRATORY_TRACT | Status: DC | PRN

## 2017-11-04 MED ORDER — METOPROLOL TARTRATE 25 MG PO TABS
25.0000 mg | ORAL_TABLET | Freq: Two times a day (BID) | ORAL | Status: DC
  Administered 2017-11-04 – 2017-11-07 (×7): 25 mg via ORAL
  Filled 2017-11-04 (×7): qty 1

## 2017-11-04 MED ORDER — CANAGLIFLOZIN 100 MG PO TABS
100.0000 mg | ORAL_TABLET | Freq: Every day | ORAL | Status: DC
  Filled 2017-11-04: qty 1

## 2017-11-04 MED ORDER — HEPARIN BOLUS VIA INFUSION
4000.0000 [IU] | Freq: Once | INTRAVENOUS | Status: AC
  Administered 2017-11-04: 4000 [IU] via INTRAVENOUS
  Filled 2017-11-04: qty 4000

## 2017-11-04 MED ORDER — METOPROLOL TARTRATE 50 MG PO TABS: 50.0000 mg | ORAL_TABLET | Freq: Two times a day (BID) | ORAL | Status: DC

## 2017-11-04 MED ORDER — AMIODARONE HCL IN DEXTROSE 360-4.14 MG/200ML-% IV SOLN: 30.0000 mg/h | INTRAVENOUS | Status: DC

## 2017-11-04 MED ORDER — SODIUM CHLORIDE 0.9 % IV BOLUS (SEPSIS)
1000.0000 mL | Freq: Once | INTRAVENOUS | Status: AC
  Administered 2017-11-04: 1000 mL via INTRAVENOUS

## 2017-11-04 MED ORDER — CANAGLIFLOZIN 100 MG PO TABS: 100.0000 mg | ORAL_TABLET | Freq: Every day | ORAL | Status: DC

## 2017-11-04 MED ORDER — INSULIN PUMP
Freq: Three times a day (TID) | SUBCUTANEOUS | Status: DC
  Filled 2017-11-04: qty 1

## 2017-11-04 MED ORDER — SODIUM CHLORIDE 0.9 % IV SOLN
3.0000 g | Freq: Four times a day (QID) | INTRAVENOUS | Status: DC
  Administered 2017-11-04 – 2017-11-06 (×7): 3 g via INTRAVENOUS
  Filled 2017-11-04 (×9): qty 3

## 2017-11-04 MED ORDER — GUAIFENESIN ER 600 MG PO TB12
600.0000 mg | ORAL_TABLET | Freq: Two times a day (BID) | ORAL | Status: DC
  Administered 2017-11-04 – 2017-11-07 (×6): 600 mg via ORAL
  Filled 2017-11-04 (×6): qty 1

## 2017-11-04 MED ORDER — HEPARIN (PORCINE) IN NACL 100-0.45 UNIT/ML-% IJ SOLN
1050.0000 [IU]/h | INTRAMUSCULAR | Status: DC
  Administered 2017-11-04: 1050 [IU]/h via INTRAVENOUS
  Filled 2017-11-04: qty 250

## 2017-11-04 MED ORDER — SODIUM CHLORIDE 0.9 % IV SOLN
INTRAVENOUS | Status: DC
  Administered 2017-11-04 (×2): via INTRAVENOUS

## 2017-11-04 MED ORDER — DILTIAZEM HCL-DEXTROSE 100-5 MG/100ML-% IV SOLN (PREMIX)
5.0000 mg/h | INTRAVENOUS | Status: DC
Start: 2017-11-04 — End: 2017-11-04

## 2017-11-04 MED ORDER — DILTIAZEM HCL-DEXTROSE 100-5 MG/100ML-% IV SOLN (PREMIX)
5.0000 mg/h | INTRAVENOUS | Status: DC
Start: 2017-11-04 — End: 2017-11-05
  Administered 2017-11-04: 15 mg/h via INTRAVENOUS
  Administered 2017-11-04: 5 mg/h via INTRAVENOUS
  Administered 2017-11-04: 15 mg/h via INTRAVENOUS
  Administered 2017-11-05: 5 mg/h via INTRAVENOUS
  Filled 2017-11-04 (×4): qty 100

## 2017-11-04 MED ORDER — ENOXAPARIN SODIUM 40 MG/0.4ML ~~LOC~~ SOLN: 40.0000 mg | SUBCUTANEOUS | Status: DC

## 2017-11-04 MED ORDER — METOPROLOL TARTRATE 5 MG/5ML IV SOLN
5.0000 mg | Freq: Once | INTRAVENOUS | Status: AC
  Administered 2017-11-04: 5 mg via INTRAVENOUS

## 2017-11-04 MED ORDER — METOPROLOL TARTRATE 5 MG/5ML IV SOLN: 2.5000 mg | INTRAVENOUS | Status: DC | PRN

## 2017-11-04 MED ORDER — AMIODARONE HCL IN DEXTROSE 360-4.14 MG/200ML-% IV SOLN
60.0000 mg/h | INTRAVENOUS | Status: DC
  Filled 2017-11-04: qty 200

## 2017-11-04 MED ORDER — HEPARIN (PORCINE) IN NACL 100-0.45 UNIT/ML-% IJ SOLN
1000.0000 [IU]/h | INTRAMUSCULAR | Status: DC
  Administered 2017-11-04: 1000 [IU]/h via INTRAVENOUS
  Filled 2017-11-04: qty 250

## 2017-11-04 MED ORDER — METOPROLOL TARTRATE 5 MG/5ML IV SOLN
INTRAVENOUS | Status: AC
  Filled 2017-11-04: qty 5

## 2017-11-04 MED ORDER — INSULIN GLARGINE 100 UNIT/ML ~~LOC~~ SOLN
15.0000 [IU] | Freq: Every day | SUBCUTANEOUS | Status: DC
  Administered 2017-11-04: 15 [IU] via SUBCUTANEOUS
  Filled 2017-11-04 (×2): qty 0.15

## 2017-11-04 MED ORDER — INSULIN ASPART 100 UNIT/ML ~~LOC~~ SOLN
0.0000 [IU] | SUBCUTANEOUS | Status: DC
  Administered 2017-11-05 (×3): 2 [IU] via SUBCUTANEOUS

## 2017-11-04 MED ORDER — DILTIAZEM LOAD VIA INFUSION
10.0000 mg | Freq: Once | INTRAVENOUS | Status: AC
  Administered 2017-11-04: 10 mg via INTRAVENOUS
  Filled 2017-11-04: qty 10

## 2017-11-04 NOTE — Consult Note (Signed)
Cardiology Consultation:   Patient ID: Scott Mosley; [id number]; 09-04-1961   Admit date: 11/03/2017 Date of Consult: 11/04/2017  Primary Care Provider: Patient, No Pcp Per Scott Mosley Primary Cardiologist: No primary care provider on file.  Scott Mosley Primary Electrophysiologist:     Patient Profile:   Scott Mosley is a 56 y.o. male with a hx of type 1 diabetes who is being seen today for the evaluation of elevated serum troponin at the request of Scott Mosley.  He has a long history of type 1 diabetes beginning at age 5.  There is no prior cardiac history.  He does have hyperlipidemia on pravastatin.  He began getting nauseated yesterday and was admitted with DKA.  His troponins were mildly elevated at approximately 2 with a downward trend appears EKG showed right bundle branch block with sinus tachycardia.  He denies chest pain.  We are asked to see to evaluate the elevated troponin.  History of Present Illness:   Scott Mosley is a 56 year old mildly overweight married Caucasian male father of 2 adult children who is currently not working.  His company by his wife today.  He is a patient of Scott Mosley.  He is a type 1 diabetes since age 56.  There is been no prior history of cardiac disease.  He does have hyperlipidemia.  He was admitted with DKA and had mildly elevated troponin.  Past Medical History:  Diagnosis Date  . Diabetes mellitus without complication (HCC)     History reviewed. No pertinent surgical history.   Home Medications:  Prior to Admission medications   Medication Sig Start Date End Date Taking? Authorizing Provider  aspirin EC 81 MG tablet Take 81 mg by mouth daily.   Yes [provider]  canagliflozin (INVOKANA) 100 MG TABS tablet Take by mouth daily before breakfast.   Yes [provider]  insulin lispro (HUMALOG) 100 UNIT/ML injection Inject into the skin 3 (three) times daily before meals. Cont- insulin pump   Yes [provider]  pravastatin (PRAVACHOL) 80 MG tablet Take 80 mg by mouth daily.   Yes [provider]  ramipril (ALTACE) 10 MG capsule Take 10 mg by mouth daily.   Yes [provider]  meloxicam (MOBIC) 15 MG tablet Take 1 tablet (15 mg total) by mouth daily. Patient not taking: Reported on 11/03/2017 09/08/17   Scott Mosley    Inpatient Medications: Scheduled Meds: . aspirin EC  81 mg Oral Daily  . pantoprazole (PROTONIX) IV  40 mg Intravenous Q24H  . pravastatin  80 mg Oral Daily  . ramipril  10 mg Oral Daily   Continuous Infusions: . sodium chloride Stopped (11/03/17 1836)  . dextrose 5 % and 0.45% NaCl 100 mL/hr at 11/04/17 0000  . heparin 1,050 Units/hr (11/04/17 0208)  . insulin (NOVOLIN-R) infusion 1.4 Units/hr (11/04/17 0746)   PRN Meds: acetaminophen **OR** acetaminophen, bisacodyl, HYDROcodone-acetaminophen, metoCLOPramide (REGLAN) injection, morphine injection, senna-docusate  Allergies:   No Known Allergies  Social History:   Social History   Socioeconomic History  . Marital status: Married    Spouse name: Not on file  . Number of children: Not on file  . Years of education: Not on file  . Highest education level: Not on file  Social Needs  . Financial resource strain: Not on file  . Food insecurity - worry: Not on file  . Food insecurity - inability: Not on file  . Transportation needs - medical:  Not on file  . Transportation needs - non-medical: Not on file  Occupational History  . Not on file  Tobacco Use  . Smoking status: Never Smoker  . Smokeless tobacco: Never Used  Substance and Sexual Activity  . Alcohol use: No    Alcohol/week: 0.0 oz  . Drug use: No  . Sexual activity: No  Other Topics Concern  . Not on file  Social History Narrative  . Not on file    Family History:   History reviewed. No pertinent family history.   ROS:  Please see the history of present illness.  ROS  All other ROS reviewed and negative.       Physical Exam/Data:   Vitals:   11/03/17 1947 11/03/17 2351 11/04/17 0323 11/04/17 0834  BP: (!) 122/58 (!) 120/57 125/60 136/62  Pulse: (!) 110 (!) 111 (!) 111 (!) 111  Resp: (!) 22 (!) 21 20 (!) 26  Temp: 98.8 F (37.1 C) 98.9 F (37.2 C) 98.8 F (37.1 C) 98.1 F (36.7 C)  TempSrc: Oral Oral Oral Oral  SpO2: 99% 97% 98% 96%  Weight:   192 lb 7.4 oz (87.3 kg)   Height:        Intake/Output Summary (Last 24 hours) at 11/04/2017 0849 Last data filed at 11/04/2017 0300 Gross per 24 hour  Intake 1246.61 ml  Output 2400 ml  Net -1153.39 ml   Filed Weights   11/03/17 0722 11/03/17 1100 11/04/17 0323  Weight: 195 lb (88.5 kg) 194 lb 10.7 oz (88.3 kg) 192 lb 7.4 oz (87.3 kg)   Body mass index is 29.26 kg/m.  General:  Well nourished, well developed, in no acute distress HEENT: normal Lymph: no adenopathy Neck: no JVD Endocrine:  No thryomegaly Vascular: No carotid bruits; FA pulses 2+ bilaterally without bruits  Cardiac:  normal S1, S2; RRR; no murmur somewhat tachycardic Lungs:  clear to auscultation bilaterally, no wheezing, rhonchi or rales  Abd: soft, nontender, no hepatomegaly  Ext: no edema Musculoskeletal:  No deformities, BUE and BLE strength normal and equal Skin: warm and dry  Neuro:  CNs 2-12 intact, no focal abnormalities noted Psych:  Normal affect   EKG:  The EKG was personally reviewed and demonstrates: Sinus tachycardia with right bundle branch block Telemetry:  Telemetry was personally reviewed and demonstrates: Sinus tachycardia  Relevant CV Studies: None  Laboratory Data:  Chemistry Recent Labs  Lab 11/03/17 1658 11/03/17 2123 11/04/17 0330  NA 138 134* 139  K 4.0 4.3 3.9  CL 108 106 110  CO2 15* 18* 17*  GLUCOSE 257* 148* 152*  BUN 52* 44* 39*  CREATININE 1.90* 1.43* 1.25*  CALCIUM 8.6* 8.3* 8.4*  GFRNONAA 37* 53* >60  GFRAA 43* >60 >60  ANIONGAP 15 10 12     Recent Labs  Lab 11/03/17 0724 11/03/17 1015 11/04/17 0330  PROT  7.3  --  5.5*  ALBUMIN 4.5  --  3.3*  AST 31  --  27  ALT 22  --  19  ALKPHOS 98  --  71  BILITOT 3.4* 2.3* 1.1   Hematology Recent Labs  Lab 11/03/17 0724 11/04/17 0330  WBC 21.7* 11.9*  RBC 4.90 4.57  HGB 14.7 13.6  HCT 45.7 39.2  MCV 93.3 85.8  MCH 30.0 29.8  MCHC 32.2 34.7  RDW 13.3 13.0  PLT 229 185   Cardiac Enzymes Recent Labs  Lab 11/03/17 1015 11/03/17 1407 11/03/17 2123 11/04/17 0330  TROPONINI <0.03 0.60* 2.34* 1.83*  Recent Labs  Lab 11/03/17 0805  TROPIPOC 0.02    BNPNo results for input(s): BNP, PROBNP in the last 168 hours.  DDimer No results for input(s): DDIMER in the last 168 hours.  Radiology/Studies:  Ct Abdomen Pelvis Wo Contrast  Result Date: 11/03/2017 CLINICAL DATA:  56 year old male with nausea, vomiting and upper abdominal pain. Elevated lipase. EXAM: CT ABDOMEN AND PELVIS WITHOUT CONTRAST TECHNIQUE: Multidetector CT imaging of the abdomen and pelvis was performed following the standard protocol without IV contrast. COMPARISON:  None. FINDINGS: Lower chest: Patchy ground-glass attenuation airspace opacity and macro nodularity in a peribronchovascular distribution in the left lower and right middle lobes. No evidence of pleural effusion. Mild circumferential esophageal wall thickening. Oral contrast material is present within the visualized esophagus consistent with reflux. Hepatobiliary: The hepatic parenchyma is diffusely low in attenuation consistent with hepatic steatosis. High attenuation material in the gallbladder lumen suggests sludge and/ or small stones. No radiopaque calcified stones visualized. No intra or extrahepatic biliary ductal dilatation. Pancreas: Unremarkable. No pancreatic ductal dilatation or surrounding inflammatory changes. Spleen: Normal in size without focal abnormality. Adrenals/Urinary Tract: Adrenal glands are unremarkable. Kidneys are normal, without renal calculi, focal lesion, or hydronephrosis. The bladder is  distended with urine. At 18.6 x 10.1 x 11.7 cm (volume = 1150 cm^3) Stomach/Bowel: No evidence of obstruction or focal bowel wall thickening. Normal appendix in the right lower quadrant. The terminal ileum is unremarkable. Vascular/Lymphatic: Limited evaluation in the absence of intravenous contrast. No aneurysm or significant atherosclerotic vascular calcifications. No suspicious lymphadenopathy. Reproductive: Prostate is unremarkable. Other: No abdominal wall hernia or abnormality. No abdominopelvic ascites. Musculoskeletal: No acute fracture or aggressive appearing lytic or blastic osseous lesion. IMPRESSION: 1. Patchy ground-glass attenuation airspace opacities and macro nodularity within the left lower and right middle lobes most consistent with multifocal pneumonia. 2. The no definitive imaging findings for acute pancreatitis. 3. Circumferential esophageal wall thickening. Oral contrast material is noted throughout the visualized thoracic esophagus. Findings suggest reflux and likely esophagitis. 4. Bladder distention. There is an estimated a 1.15 L of volume within the bladder. 5. High attenuation material within the gallbladder lumen suggests the presence of sludge. No radiopaque stones identified. 6. Hepatic steatosis. Electronically Signed   By: Malachy MoanHeath  McCullough M.D.   On: 11/03/2017 13:43   Dg Chest Port 1 View  Result Date: 11/03/2017 CLINICAL DATA:  Pneumonia EXAM: PORTABLE CHEST 1 VIEW COMPARISON:  CT 11/03/2017 FINDINGS: Mild cardiomegaly. Previously noted lower lung infiltrates on CT are not well visualized radiographically. No pleural effusion. No pneumothorax. IMPRESSION: 1. Mild cardiomegaly 2. CT demonstrated lower lung infiltrates are not well seen on this single view chest. Electronically Signed   By: Jasmine PangKim  Fujinaga M.D.   On: 11/03/2017 16:13    Assessment and Plan:   1. Elevated troponin-Mr. Leavy CellaBoyd had a troponin of 2.34 falling to 1.83.  He did have sinus tachycardia with right  bundle branch block.  He had no chest pain.  There is no prior cardiac history.  He was admitted with DKA.  I suspect this was a type II non-STEMI related to demand ischemia from sinus tachycardia related to DKA.  We will check a 2D echo.  No further workup is required at this time. 2. Hyperlipidemia-on pravastatin as an outpatient 3. Type 1 diabetes- admitted with DKA, treated by primary service   For questions or updates, please contact [facility] Please consult [email or web address] for contact info under Cardiology/STEMI.   Signed, Nanetta BattyJonathan Berry, MD  11/04/2017 8:49 AM

## 2017-11-04 NOTE — Progress Notes (Signed)
[facility] Progress Note  Patient: Scott Mosley ZOX:[id number]   PCP: Patient, No Pcp Per DOB: [birth year]   DOA: 11/03/2017   DOS: 11/04/2017   Date of Service: the patient was seen and examined on 11/04/2017  Subjective: Feeling tired and lethargic, also has cough and shortness of breath.  No abdominal pain no chest pain.  Had some nausea and vomiting yesterday but currently resolved.  Minimal oral intake  Brief hospital course: Pt. with PMH of type I DM, left eye blindness, HLD; admitted on 11/03/2017, presented with complaint of nausea and vomiting and not feeling well with weakness and lethargy, was found to have DKA, elevated troponin, A. fib with RVR, left lower lobe pneumonia. Currently further plan is continue current management.  Assessment and Plan: 1.  DKA. Type I DM, uncontrolled, hemoglobin A1c 7.7. Diabetes coordinator can consulted. Most likely insulin pump site failure causing the DKA, this is patient's first DKA in life. Received IV hydration, IV insulin. Patient is out of the DKA, anion gap has closed.  Unable to transition to insulin pump due to lethargy and weakness and therefore will be using basal bolus regimen for him right now. Patient roughly gets 26 units of continuous insulin with 6-7 units of bolus pre-meal coverage. I will be using 15 units of Lantus with q. 4-hour sensitive sliding scale. Will also use 4 units of pre-meal coverage as needed. Continue IV normal saline 125 cc/h. Once the patient is more awake and ready to use the insulin pump transition to that.  2.  Elevated troponin, type II non-STEMI. Cardiology consulted due to elevated troponin, thinks that this is demand ischemia. Echocardiogram shows preserved EF without any wall motion abnormalities. No further workup planned from cardiac decided.  3.  Left lower lobe pneumonia. Acute hypoxic respiratory failure. Starting the patient on IV Unasyn for concern for possible  pneumonia. Monitor.  4.  A. fib with RVR. Patient developed A. fib with RVR later during the day, started on IV Lopressor without any benefit, IV Cardizem infusion without any benefit. After discussing with the cardiology the patient is started on amiodarone drip. We will monitor and continue with IV hydration right now. Started back on heparin for anticoagulation  5.  Essential hypertension. Patient is on ramipril at home, currently holding that in order to provide room for rate control medication.  Diet: carb modified diet DVT Prophylaxis: Heparin  Advance goals of care discussion: full code  Family Communication: family was present at bedside, at the time of interview. The pt provided permission to discuss medical plan with the family. Opportunity was given to ask question and all questions were answered satisfactorily.   Disposition:  Discharge to be determined.   Consultants: cardiology Procedures: Echocardiogram   Antibiotics: Anti-infectives (From admission, onward)   Start     Dose/Rate Route Frequency Ordered Stop   11/04/17 1500  Ampicillin-Sulbactam (UNASYN) 3 g in sodium chloride 0.9 % 100 mL IVPB     3 g 200 mL/hr over 30 Minutes Intravenous Every 6 hours 11/04/17 1459         Objective: Physical Exam: Vitals:   11/04/17 1520 11/04/17 1546 11/04/17 1628 11/04/17 1641  BP: 118/64 125/65 114/60 101/69  Pulse: 81  93 (!) 105  Resp: (!) 31 (!) 25 (!) 29 (!) 26  Temp:      TempSrc:      SpO2: 94%  97% 96%  Weight:      Height:  Intake/Output Summary (Last 24 hours) at 11/04/2017 1905 Last data filed at 11/04/2017 1137 Gross per 24 hour  Intake 644.2 ml  Output 1600 ml  Net -955.8 ml   Filed Weights   11/03/17 0722 11/03/17 1100 11/04/17 0323  Weight: 88.5 kg (195 lb) 88.3 kg (194 lb 10.7 oz) 87.3 kg (192 lb 7.4 oz)   General: Alert, Awake and Oriented to Time, Place and Person. Appear in marked distress, affect appropriate Eyes: left eye blind  ness, Conjunctiva normal ENT: Oral Mucosa clear moist. Neck: no JVD, no Abnormal Mass Or lumps Cardiovascular: S1 and S2 Present, no Murmur, Peripheral Pulses Present Respiratory: increased respiratory effort, Bilateral Air entry equal and Decreased, positive use of accessory muscle, left Crackles, no wheezes Abdomen: Bowel Sound present, Soft and no tenderness, no hernia Skin: no redness, no Rash, no induration Extremities: no Pedal edema, no calf tenderness Neurologic: Grossly no focal neuro deficit. Bilaterally Equal motor strength  Data Reviewed: CBC: Recent Labs  Lab 11/03/17 0724 11/04/17 0330  WBC 21.7* 11.9*  HGB 14.7 13.6  HCT 45.7 39.2  MCV 93.3 85.8  PLT 229 185   Basic Metabolic Panel: Recent Labs  Lab 11/03/17 1015 11/03/17 1407 11/03/17 1658 11/03/17 2123 11/04/17 0330 11/04/17 1426  NA 132* 133* 138 134* 139 139  K 5.1 4.2 4.0 4.3 3.9 3.9  CL 97* 101 108 106 110 112*  CO2 <7* 9* 15* 18* 17* 14*  GLUCOSE 742* 487* 257* 148* 152* 233*  BUN 55* 57* 52* 44* 39* 30*  CREATININE 2.84* 2.49* 1.90* 1.43* 1.25* 1.12  CALCIUM 8.5* 8.5* 8.6* 8.3* 8.4* 8.2*  MG 2.6*  --   --   --   --  2.2    Liver Function Tests: Recent Labs  Lab 11/03/17 0724 11/03/17 1015 11/04/17 0330  AST 31  --  27  ALT 22  --  19  ALKPHOS 98  --  71  BILITOT 3.4* 2.3* 1.1  PROT 7.3  --  5.5*  ALBUMIN 4.5  --  3.3*   Recent Labs  Lab 11/03/17 0724  LIPASE 168*   No results for input(s): AMMONIA in the last 168 hours. Coagulation Profile: Recent Labs  Lab 11/04/17 0330  INR 1.26   Cardiac Enzymes: Recent Labs  Lab 11/03/17 1407 11/03/17 2123 11/04/17 0330 11/04/17 0913 11/04/17 1426  TROPONINI 0.60* 2.34* 1.83* 1.05* 0.71*   BNP (last 3 results) No results for input(s): PROBNP in the last 8760 hours. CBG: Recent Labs  Lab 11/04/17 1349 11/04/17 1506 11/04/17 1616 11/04/17 1756 11/04/17 1900  GLUCAP 139* 213* 165* 126* 125*   Studies: Dg Chest 2  View  Result Date: 11/04/2017 CLINICAL DATA:  Lung crackles. EXAM: CHEST  2 VIEW COMPARISON:  Radiograph of November 03, 2017. FINDINGS: Stable cardiomediastinal silhouette. No pneumothorax is noted. Mild left lower lobe opacity is noted concerning for possible pneumonia. No significant pleural effusion is noted. Bony thorax is unremarkable. IMPRESSION: Mild left lower lobe opacity is noted concerning for possible pneumonia. Electronically Signed   By: Lupita RaiderJames  Green Jr, M.D.   On: 11/04/2017 10:30    Scheduled Meds: . aspirin EC  81 mg Oral Daily  . insulin aspart  0-9 Units Subcutaneous Q4H  . insulin glargine  15 Units Subcutaneous Daily  . insulin pump   Subcutaneous TID AC, HS, 0200  . metoprolol tartrate  25 mg Oral BID  . pantoprazole (PROTONIX) IV  40 mg Intravenous Q24H  . pravastatin  80 mg  Oral Daily   Continuous Infusions: . sodium chloride 125 mL/hr at 11/04/17 1445  . ampicillin-sulbactam (UNASYN) IV 3 g (11/04/17 1901)  . diltiazem (CARDIZEM) infusion 15 mg/hr (11/04/17 1610)  . heparin     PRN Meds: acetaminophen **OR** acetaminophen, bisacodyl, HYDROcodone-acetaminophen, metoCLOPramide (REGLAN) injection, metoprolol tartrate, senna-docusate  Time spent: The patient is critically ill with multiple organ systems failure and requires high complexity decision making for assessment and support, frequent evaluation and titration of therapies. Critical Care Time devoted to patient care services described in this note is 45 minutes   Author: Lynden Oxford, MD [facility] Pager: [phone] 11/04/2017 7:05 PM  If 7PM-7AM, please contact night-coverage at [email or web address], password [facility]

## 2017-11-04 NOTE — Progress Notes (Signed)
Follow-up: Notified by RN regarding result of 2nd troponin. Result of troponin at 1407 rose from neg (<0.03) to 0.60. Troponin at 2123 was 2.34. There have no c/o's of CP and repeat 12-lead EKG revealed ST w/ rate of 109 w/  RBBB but no ischemic changes. Record reviewed. No documented h/o CAD. In 2010 pt did have a nuclear stress study that was essentially unremarkable (no EKG changes suggestive of ischemia) after presenting to ED for "fatigue". Remains on insulin drip for DKA.   Assessment/Plan: 1. Elevated troponin: w/o c/o CP or ischemic changes on EKG. Orders were placed to continue cycling troponin's. Spoke w/ Dr Mayford Knifeurner w/ cardiology service who feels this is likely demand ischemia but has recommended starting heparin drip and have rounding MD re-consult in am. Will continue to monitor closely on SDU.   Scott ChangKatherine P. Schorr, NP-C [facility] Pager [phone]

## 2017-11-04 NOTE — Progress Notes (Signed)
Pharmacy Antibiotic Note  Scott Mosley is a 56 y.o. male admitted on 11/03/2017 with N&V and DKA.  Pharmacy has been consulted for Ampicillin/Sulbactam dosing for possible aspiration pneumonia.  He initially had some renal insufficiency however, his Creatinine has decreased from 1.9 to 1.25 today and his estimated GFR is > 6660ml/min.  His WBC is 11.9 and a temp. of 97.5.  He is tachy with rates of 120's to 160's. (currently on Diltiazem)  12/25 CT: "Patchy ground-glass attenuation airspace opacities and macro nodularity within the left lower and right middle lobes most consistent with multifocal pneumonia" 12/26 CXR: "Mild left lower lobe opacity is noted concerning for possible pneumonia"  Plan: Unasyn 3gm IV q 6 hours Will monitor renal function, clinical response, length of therapy.  Height: 5\' 8"  (172.7 cm) Weight: 192 lb 7.4 oz (87.3 kg) IBW/kg (Calculated) : 68.4  Temp (24hrs), Avg:98.4 F (36.9 C), Min:97.5 F (36.4 C), Max:98.9 F (37.2 C)  Recent Labs  Lab 11/03/17 0724 11/03/17 1015 11/03/17 1026 11/03/17 1407 11/03/17 1658 11/03/17 2123 11/04/17 0330  WBC 21.7*  --   --   --   --   --  11.9*  CREATININE 2.79* 2.84*  --  2.49* 1.90* 1.43* 1.25*  LATICACIDVEN  --   --  4.5* 2.4*  --   --   --     Estimated Creatinine Clearance: 69.2 mL/min (A) (by C-G formula based on SCr of 1.25 mg/dL (H)).    No Known Allergies  Antimicrobials this admission: Unasyn 12/26>>  Dose adjustments this admission: N/A  Microbiology results: No culture data  Nadara MustardNita Graceann Boileau, PharmD., MS Clinical Pharmacist Pager:  [phone] Thank you for allowing pharmacy to be part of this patients care team. 11/04/2017 2:50 PM

## 2017-11-04 NOTE — Progress Notes (Signed)
ANTICOAGULATION CONSULT NOTE - Initial Consult  Pharmacy Consult for Heparin Indication: chest pain/ACS  No Known Allergies  Patient Measurements: Height: 5\' 8"  (172.7 cm) Weight: 194 lb 10.7 oz (88.3 kg) IBW/kg (Calculated) : 68.4 Heparin Dosing Weight: 85 kg  Vital Signs: Temp: 98.9 F (37.2 C) (12/25 2351) Temp Source: Oral (12/25 2351) BP: 120/57 (12/25 2351) Pulse Rate: 111 (12/25 2351)  Labs: Recent Labs    11/03/17 0724 11/03/17 1015 11/03/17 1407 11/03/17 1658 11/03/17 2123  HGB 14.7  --   --   --   --   HCT 45.7  --   --   --   --   PLT 229  --   --   --   --   CREATININE 2.79* 2.84* 2.49* 1.90* 1.43*  TROPONINI  --  <0.03 0.60*  --  2.34*    Estimated Creatinine Clearance: 60.8 mL/min (A) (by C-G formula based on SCr of 1.43 mg/dL (H)).   Medical History: Past Medical History:  Diagnosis Date  . Diabetes mellitus without complication (HCC)     Medications:  No current facility-administered medications on file prior to encounter.    Current Outpatient Medications on File Prior to Encounter  Medication Sig Dispense Refill  . aspirin EC 81 MG tablet Take 81 mg by mouth daily.    . canagliflozin (INVOKANA) 100 MG TABS tablet Take by mouth daily before breakfast.    . insulin lispro (HUMALOG) 100 UNIT/ML injection Inject into the skin 3 (three) times daily before meals. Cont- insulin pump    . pravastatin (PRAVACHOL) 80 MG tablet Take 80 mg by mouth daily.    . ramipril (ALTACE) 10 MG capsule Take 10 mg by mouth daily.    . meloxicam (MOBIC) 15 MG tablet Take 1 tablet (15 mg total) by mouth daily. (Patient not taking: Reported on 11/03/2017) 30 tablet 3     Assessment: 56 y.o. male with elevated troponins for heparin Goal of Therapy:  Heparin level 0.3-0.7   Plan:  Heparin 4000 units IV bolus, then start heparin 1050 units/hr Check heparin level in 8 hours.   Scott Mosley, Scott Mosley 11/04/2017,1:23 AM

## 2017-11-04 NOTE — Progress Notes (Signed)
Pt does not feel he is safely able to manage insulin pump at this time. Spoke with Dr. Allena KatzPatel - orders received for Lantus and sliding scale. Lantus given at 1730. Insulin drip discontinued at 1900. CBG 125 at that time. Pt due for q4 CBG at 2000.   Spoke with Dr. Allyson SabalBerry at 1800, pt had been ST for 30 minutes without initiation of IV amiodarone. Received verbal order to not start. Pt remains on Cardizem at 15 mL/hr.   IV heparin started at 10 mL/hr via R hand. IV antibiotics started in L FA IV. Leonidas Rombergaitlin S Bumbledare, RN

## 2017-11-04 NOTE — Progress Notes (Signed)
Inpatient Diabetes Program Recommendations  AACE/ADA: New Consensus Statement on Inpatient Glycemic Control (2015)  Target Ranges:  Prepandial:   less than 140 mg/dL      Peak postprandial:   less than 180 mg/dL (1-2 hours)      Critically ill patients:  140 - 180 mg/dL   Lab Results  Component Value Date   GLUCAP 169 (H) 11/04/2017   HGBA1C 7.7 (H) 11/03/2017    Review of Glycemic Control  Diabetes history: DM1 Outpatient Diabetes medications: Insulin pump Minimed  Current orders for Inpatient glycemic control: Insulin IV drip  Inpatient Diabetes Program Recommendations:   Spoke with patient and wife @ bedside. Patient states he attempted to correct his CBGs when elevated @ home but continued to be elevated. Wife to get new supplies to place a new insertion site for pump from home.  When transitioned from IV insulin drip to insulin pump will need to overlap x 1 hr. Noted CO2 17 and anion gap 12. Patient's insulin pump settings: 12 am .75 units/hr   4 am 1.45 units/hr    8 am 1.05 units/hr  6:30 pm 1.15 units/hr 8 pm 1.25 units/hr Patient normally uses approx 7-8 units per meal for meal coverage.  Will follow patient during hospitalization.  Thank you, Billy FischerJudy E. Myron Lona, RN, MSN, CDE  Diabetes Coordinator Inpatient Glycemic Control Team Team Pager [phone] (8am-5pm) 11/04/2017 11:17 AM

## 2017-11-04 NOTE — Progress Notes (Signed)
Notified by CCMD of elevated HR in the 150s. Upon entering room pt complains of not feeling well, denies chest pain or shortness of breath. HR 150s to 180s. Dr. Allena KatzPatel paged. EKG obtained. BP 115/48 at 1400. One time 5mg  metoprolol given at 1413. Fluid bolus started. Dr. Allena KatzPatel spoke with Dr. Allyson SabalBerry and new orders received. BP dropped, pt denies being lightheaded or dizzy. 1438 BP 89/55. Pt BP at 1450 up to 129/52. Cardizem gtt started with bolus at 1458.   Wife and son at bedside. Updated and educated as to plan of care. Verbalized understanding. Call bell within reach, will continue to monitor.   Scott Rombergaitlin S Bumbledare, RN

## 2017-11-04 NOTE — Progress Notes (Signed)
  Echocardiogram 2D Echocardiogram has been performed.  Delcie RochENNINGTON, Adeeb Konecny 11/04/2017, 10:27 AM

## 2017-11-04 NOTE — Progress Notes (Signed)
ANTICOAGULATION CONSULT NOTE - Follow Up Consult  Pharmacy Consult for Heparin Indication: new Afib  No Known Allergies  Patient Measurements: Height: 5\' 8"  (172.7 cm) Weight: 192 lb 7.4 oz (87.3 kg) IBW/kg (Calculated) : 68.4 Heparin Dosing Weight: 85 kg  Vital Signs: Temp: 97.5 F (36.4 C) (12/26 1137) Temp Source: Oral (12/26 1137) BP: 101/69 (12/26 1641) Pulse Rate: 105 (12/26 1641)  Labs: Recent Labs    11/03/17 0724  11/03/17 2123 11/04/17 0330 11/04/17 0913 11/04/17 1426  HGB 14.7  --   --  13.6  --   --   HCT 45.7  --   --  39.2  --   --   PLT 229  --   --  185  --   --   LABPROT  --   --   --  15.7*  --   --   INR  --   --   --  1.26  --   --   HEPARINUNFRC  --   --   --   --  0.85*  --   CREATININE 2.79*   < > 1.43* 1.25*  --  1.12  TROPONINI  --    < > 2.34* 1.83* 1.05* 0.71*   < > = values in this interval not displayed.    Estimated Creatinine Clearance: 77.3 mL/min (by C-G formula based on SCr of 1.12 mg/dL).   Medical History: Past Medical History:  Diagnosis Date  . Diabetes mellitus without complication (HCC)     Medications:  No current facility-administered medications on file prior to encounter.    Current Outpatient Medications on File Prior to Encounter  Medication Sig Dispense Refill  . aspirin EC 81 MG tablet Take 81 mg by mouth daily.    . canagliflozin (INVOKANA) 100 MG TABS tablet Take by mouth daily before breakfast.    . insulin lispro (HUMALOG) 100 UNIT/ML injection Inject into the skin 3 (three) times daily before meals. Cont- insulin pump    . pravastatin (PRAVACHOL) 80 MG tablet Take 80 mg by mouth daily.    . ramipril (ALTACE) 10 MG capsule Take 10 mg by mouth daily.    . meloxicam (MOBIC) 15 MG tablet Take 1 tablet (15 mg total) by mouth daily. (Patient not taking: Reported on 11/03/2017) 30 tablet 3     Assessment: 56 y.o. male admitted with CP and elevated troponin - thought to be demand ischemia.  He was started on  heparin drip but that was stopped earlier today.  He now is in Afib RVR being treated with diltiazem and amiodarone.  Plan to restart heparin drip.    Goal of Therapy:  Heparin level 0.3-0.7   Plan:  Restart heparin drip 1000 uts/hr  Heparin level 6hr from start Daily HL, CBC  Leota SauersLisa Sheenah Dimitroff Pharm.D. CPP, BCPS Clinical Pharmacist [phone] 11/04/2017 5:22 PM

## 2017-11-04 NOTE — Progress Notes (Signed)
    Called by RN in regards to patient's HR remaining elevated in the 160-170s with Dilt drip at 15mg /hr. Given new onset Afib will give amio bolus and start on drip. Will resume IV heparin for now. Discussed with Dr. Allyson SabalBerry.   Janice CoffinSigned, Jaray Boliver, NP-C 11/04/2017, 4:57 PM Pager: [phone]

## 2017-11-05 DIAGNOSIS — I480 Paroxysmal atrial fibrillation: Secondary | ICD-10-CM

## 2017-11-05 LAB — GLUCOSE, CAPILLARY
GLUCOSE-CAPILLARY: 154 mg/dL — AB (ref 65–99)
GLUCOSE-CAPILLARY: 111 mg/dL — AB (ref 65–99)
Glucose-Capillary: 105 mg/dL — ABNORMAL HIGH (ref 65–99)
Glucose-Capillary: 109 mg/dL — ABNORMAL HIGH (ref 65–99)
Glucose-Capillary: 157 mg/dL — ABNORMAL HIGH (ref 65–99)
Glucose-Capillary: 163 mg/dL — ABNORMAL HIGH (ref 65–99)
Glucose-Capillary: 168 mg/dL — ABNORMAL HIGH (ref 65–99)

## 2017-11-05 LAB — CBC WITH DIFFERENTIAL/PLATELET
BASOS PCT: 0 %
Basophils Absolute: 0 10*3/uL (ref 0.0–0.1)
Eosinophils Absolute: 0 10*3/uL (ref 0.0–0.7)
Eosinophils Relative: 0 %
HEMATOCRIT: 37.5 % — AB (ref 39.0–52.0)
HEMOGLOBIN: 12.4 g/dL — AB (ref 13.0–17.0)
LYMPHS ABS: 0.6 10*3/uL — AB (ref 0.7–4.0)
LYMPHS PCT: 9 %
MCH: 29 pg (ref 26.0–34.0)
MCHC: 33.1 g/dL (ref 30.0–36.0)
MCV: 87.6 fL (ref 78.0–100.0)
MONOS PCT: 12 %
Monocytes Absolute: 0.9 10*3/uL (ref 0.1–1.0)
NEUTROS ABS: 5.8 10*3/uL (ref 1.7–7.7)
NEUTROS PCT: 79 %
Platelets: 155 10*3/uL (ref 150–400)
RBC: 4.28 MIL/uL (ref 4.22–5.81)
RDW: 13.7 % (ref 11.5–15.5)
WBC: 7.3 10*3/uL (ref 4.0–10.5)

## 2017-11-05 LAB — COMPREHENSIVE METABOLIC PANEL
ALBUMIN: 2.9 g/dL — AB (ref 3.5–5.0)
ALK PHOS: 69 U/L (ref 38–126)
ALT: 18 U/L (ref 17–63)
ANION GAP: 14 (ref 5–15)
AST: 23 U/L (ref 15–41)
BILIRUBIN TOTAL: 1.8 mg/dL — AB (ref 0.3–1.2)
BUN: 24 mg/dL — ABNORMAL HIGH (ref 6–20)
CALCIUM: 8.1 mg/dL — AB (ref 8.9–10.3)
CO2: 12 mmol/L — ABNORMAL LOW (ref 22–32)
Chloride: 113 mmol/L — ABNORMAL HIGH (ref 101–111)
Creatinine, Ser: 1.2 mg/dL (ref 0.61–1.24)
GFR calc Af Amer: >60 mL/min (ref >60)
GLUCOSE: 173 mg/dL — AB (ref 65–99)
Potassium: 4 mmol/L (ref 3.5–5.1)
Sodium: 139 mmol/L (ref 135–145)
TOTAL PROTEIN: 5.3 g/dL — AB (ref 6.5–8.1)

## 2017-11-05 LAB — HEPARIN LEVEL (UNFRACTIONATED)
Heparin Unfractionated: 0.35 IU/mL (ref 0.30–0.70)
Heparin Unfractionated: 0.55 IU/mL (ref 0.30–0.70)

## 2017-11-05 LAB — MAGNESIUM: MAGNESIUM: 2.2 mg/dL (ref 1.7–2.4)

## 2017-11-05 MED ORDER — SODIUM CHLORIDE 0.45 % IV SOLN
INTRAVENOUS | Status: DC
  Administered 2017-11-05 – 2017-11-07 (×3): via INTRAVENOUS

## 2017-11-05 MED ORDER — DILTIAZEM HCL 60 MG PO TABS
90.0000 mg | ORAL_TABLET | Freq: Four times a day (QID) | ORAL | Status: DC
  Administered 2017-11-05 – 2017-11-07 (×9): 90 mg via ORAL
  Filled 2017-11-05 (×9): qty 1

## 2017-11-05 MED ORDER — CANAGLIFLOZIN 100 MG PO TABS
100.0000 mg | ORAL_TABLET | Freq: Every day | ORAL | Status: DC
  Filled 2017-11-05: qty 1

## 2017-11-05 MED ORDER — HYDROCOD POLST-CPM POLST ER 10-8 MG/5ML PO SUER
5.0000 mL | Freq: Once | ORAL | Status: AC
  Administered 2017-11-05: 5 mL via ORAL
  Filled 2017-11-05: qty 5

## 2017-11-05 MED ORDER — INSULIN PUMP
Freq: Three times a day (TID) | SUBCUTANEOUS | Status: DC
  Administered 2017-11-05 – 2017-11-07 (×9): via SUBCUTANEOUS
  Filled 2017-11-05: qty 1

## 2017-11-05 NOTE — Progress Notes (Signed)
ANTICOAGULATION CONSULT NOTE - Follow Up Consult  Pharmacy Consult for Heparin Indication: atrial fibrillation  No Known Allergies  Patient Measurements: Height: 5\' 8"  (172.7 cm) Weight: 192 lb 10.9 oz (87.4 kg) IBW/kg (Calculated) : 68.4 Heparin Dosing Weight: 85 kg  Vital Signs: Temp: 99 F (37.2 C) (12/27 0802) Temp Source: Axillary (12/27 0802) BP: 125/68 (12/27 1240) Pulse Rate: 85 (12/27 1240)  Labs: Recent Labs    11/03/17 0724  11/04/17 0330 11/04/17 0913 11/04/17 1426 11/05/17 0104 11/05/17 0553 11/05/17 0758  HGB 14.7  --  13.6  --   --   --  12.4*  --   HCT 45.7  --  39.2  --   --   --  37.5*  --   PLT 229  --  185  --   --   --  155  --   LABPROT  --   --  15.7*  --   --   --   --   --   INR  --   --  1.26  --   --   --   --   --   HEPARINUNFRC  --   --   --  0.85*  --  0.35  --  0.55  CREATININE 2.79*   < > 1.25*  --  1.12  --  1.20  --   TROPONINI  --    < > 1.83* 1.05* 0.71*  --   --   --    < > = values in this interval not displayed.    Estimated Creatinine Clearance: 72.1 mL/min (by C-G formula based on SCr of 1.2 mg/dL).  Assessment:   Continues on IV heparin for new afib.   Heparin level remains therapeutic (0.55) on 1000 units/hr.   Noted converted to NSR on Diltiazem drip and PO Diltiazem begun.     Goal of Therapy:  Heparin level 0.3-0.7 units/ml Monitor platelets by anticoagulation protocol: Yes   Plan:   Continue heparin drip at 1000 units/hr.  Daily heparin level and CBC while on Heparin.  Will follow up heparin plans.  Cardiology notes long-term anticoagulation not currently planned.  Dennie FettersEgan, Shuan Statzer Donovan, RPh Pager: [phone] 11/05/2017,2:16 PM

## 2017-11-05 NOTE — Care Management Note (Signed)
Case Management Note Donn PieriniKristi Halsey Hammen RN, BSN Unit 4E-Case Manager [phone]  Patient Details  Name: Scott Mosley MRN: [id number] Date of Birth: August 05, 1961  Subjective/Objective:  Pt admitted with DKA, afib,  Type II non-STEMI-                   Action/Plan: PTA pt lived at home with spouse- anticipate return home - CM to follow   Expected Discharge Date:                  Expected Discharge Plan:  Home/Self Care  In-House Referral:     Discharge planning Services  CM Consult  Post Acute Care Choice:    Choice offered to:     DME Arranged:    DME Agency:     HH Arranged:    HH Agency:     Status of Service:  In process, will continue to follow  If discussed at Long Length of Stay Meetings, dates discussed:    Discharge Disposition:   Additional Comments:  Darrold SpanWebster, Verenise Moulin Hall, RN 11/05/2017, 11:34 AM

## 2017-11-05 NOTE — Progress Notes (Signed)
ANTICOAGULATION CONSULT NOTE - Follow Up Consult  Pharmacy Consult for Heparin Indication: new Afib  No Known Allergies  Patient Measurements: Height: 5\' 8"  (172.7 cm) Weight: 192 lb 7.4 oz (87.3 kg) IBW/kg (Calculated) : 68.4 Heparin Dosing Weight: 85 kg  Vital Signs: Temp: 98.5 F (36.9 C) (12/26 1920) Temp Source: Oral (12/26 1920) BP: 122/67 (12/27 0000) Pulse Rate: 93 (12/27 0000)  Labs: Recent Labs    11/03/17 0724  11/03/17 2123 11/04/17 0330 11/04/17 0913 11/04/17 1426 11/05/17 0104  HGB 14.7  --   --  13.6  --   --   --   HCT 45.7  --   --  39.2  --   --   --   PLT 229  --   --  185  --   --   --   LABPROT  --   --   --  15.7*  --   --   --   INR  --   --   --  1.26  --   --   --   HEPARINUNFRC  --   --   --   --  0.85*  --  0.35  CREATININE 2.79*   < > 1.43* 1.25*  --  1.12  --   TROPONINI  --    < > 2.34* 1.83* 1.05* 0.71*  --    < > = values in this interval not displayed.    Estimated Creatinine Clearance: 77.3 mL/min (by C-G formula based on SCr of 1.12 mg/dL).   Medical History: Past Medical History:  Diagnosis Date  . Diabetes mellitus without complication (HCC)     Medications:  No current facility-administered medications on file prior to encounter.    Current Outpatient Medications on File Prior to Encounter  Medication Sig Dispense Refill  . aspirin EC 81 MG tablet Take 81 mg by mouth daily.    . canagliflozin (INVOKANA) 100 MG TABS tablet Take by mouth daily before breakfast.    . insulin lispro (HUMALOG) 100 UNIT/ML injection Inject into the skin 3 (three) times daily before meals. Cont- insulin pump    . pravastatin (PRAVACHOL) 80 MG tablet Take 80 mg by mouth daily.    . ramipril (ALTACE) 10 MG capsule Take 10 mg by mouth daily.    . meloxicam (MOBIC) 15 MG tablet Take 1 tablet (15 mg total) by mouth daily. (Patient not taking: Reported on 11/03/2017) 30 tablet 3    Assessment: 56 y.o. male admitted with CP and elevated troponin -  thought to be demand ischemia.  Started on heparin drip per pharmacy, stopped, and now resumed for new afib.   Heparin level therapeutic at 0.35. No overt s/s bleeding noted.   Goal of Therapy:  Heparin level 0.3-0.7 Monitor platelets    Plan:  Continue heparin gtt at 1000 units/hr  Heparin level in 6 hrs to confirm  Daily heparin level and CBC Monitor for s/s bleeding  Einar CrowKatherine Weigle, PharmD Clinical Pharmacist 11/05/17 1:44 AM

## 2017-11-05 NOTE — Progress Notes (Signed)
[facility] Progress Note  Patient: Scott SchultzeJames H Girouard FAO:[id number]:[id number]   PCP: Patient, No Pcp Per DOB: 01-Aug-1961   DOA: 11/03/2017   DOS: 11/05/2017   Date of Service: the patient was seen and examined on 11/05/2017  Subjective: Feeling better as compared to yesterday although not back to baseline.  Still fatigued and tired.  Breathing is better.  Cough still present.  No nausea no vomiting.  Brief hospital course: Pt. with PMH of type I DM, left eye blindness, HLD; admitted on 11/03/2017, presented with complaint of nausea and vomiting and not feeling well with weakness and lethargy, was found to have DKA, elevated troponin, A. fib with RVR, left lower lobe pneumonia. Currently further plan is continue current management.  Assessment and Plan: 1.  DKA. Type I DM, uncontrolled, hemoglobin A1c 7.7. Insulin pump malfunction. Initially anion gap metabolic acidosis. Now non-anion gap metabolic acidosis due to hyperchloremia. Diabetes coordinator can consulted. Most likely insulin pump site failure causing the DKA, this is patient's first DKA in life. Received IV hydration, IV insulin. Patient is out of the DKA, anion gap has closed.   At patient request patient was started on basal bolus regimen on 11/04/2017. Transitioning to insulin pump today, patient roughly gets 26 units of continuous insulin with 6-7 units of bolus pre-meal coverage. Changing the fluid to half normal saline.  2.  Elevated troponin, type II non-STEMI. Cardiology consulted due to elevated troponin, thinks that this is demand ischemia. Echocardiogram shows preserved EF without any wall motion abnormalities. No further workup planned from cardiac decided. Currently signed off.  3.  Left lower lobe pneumonia. Acute hypoxic respiratory failure. Starting the patient on IV Unasyn for concern for possible pneumonia. Monitor. Clinically getting better  4.  A. fib with RVR. Patient developed A. fib with RVR later during  the day, started on IV Lopressor without any benefit, IV Cardizem infusion without any benefit. After discussing with the cardiology the patient is started on amiodarone drip which patient never received. Currently transitioning from IV Cardizem infusion to p.o. Cardizem. We will monitor and continue with IV hydration right now. Started back on heparin for anticoagulation on 11/04/2017, now discontinued since cardiology recommends no further long-term anticoagulant requirement for the patient.  5.  Essential hypertension. Patient is on ramipril at home, currently holding that in order to provide room for rate control medication.  Diet: carb modified diet DVT Prophylaxis: Heparin  Advance goals of care discussion: full code  Family Communication: family was present at bedside, at the time of interview. The pt provided permission to discuss medical plan with the family. Opportunity was given to ask question and all questions were answered satisfactorily.   Disposition:  Discharge to be determined.  PT OT consulted  Consultants: cardiology Procedures: Echocardiogram   Antibiotics: Anti-infectives (From admission, onward)   Start     Dose/Rate Route Frequency Ordered Stop   11/04/17 1500  Ampicillin-Sulbactam (UNASYN) 3 g in sodium chloride 0.9 % 100 mL IVPB     3 g 200 mL/hr over 30 Minutes Intravenous Every 6 hours 11/04/17 1459         Objective: Physical Exam: Vitals:   11/05/17 0802 11/05/17 1000 11/05/17 1240 11/05/17 1600  BP: (!) 141/64 133/63 125/68 (!) 131/59  Pulse: 96 94 85 87  Resp: (!) 23   (!) 27  Temp: 99 F (37.2 C)   (!) 97.5 F (36.4 C)  TempSrc: Axillary   Oral  SpO2: 96%   97%  Weight:  Height:        Intake/Output Summary (Last 24 hours) at 11/05/2017 1622 Last data filed at 11/05/2017 1505 Gross per 24 hour  Intake 3424.74 ml  Output 3550 ml  Net -125.26 ml   Filed Weights   11/03/17 1100 11/04/17 0323 11/05/17 0400  Weight: 88.3 kg (194  lb 10.7 oz) 87.3 kg (192 lb 7.4 oz) 87.4 kg (192 lb 10.9 oz)   General: Alert, Awake and Oriented to Time, Place and Person. Appear in marked distress, affect appropriate Eyes: left eye blind ness, Conjunctiva normal ENT: Oral Mucosa clear moist. Neck: no JVD, no Abnormal Mass Or lumps Cardiovascular: S1 and S2 Present, no Murmur, Peripheral Pulses Present Respiratory: increased respiratory effort, Bilateral Air entry equal and Decreased, positive use of accessory muscle, left Crackles, no wheezes Abdomen: Bowel Sound present, Soft and no tenderness, no hernia Skin: no redness, no Rash, no induration Extremities: no Pedal edema, no calf tenderness Neurologic: Grossly no focal neuro deficit. Bilaterally Equal motor strength  Data Reviewed: CBC: Recent Labs  Lab 11/03/17 0724 11/04/17 0330 11/05/17 0553  WBC 21.7* 11.9* 7.3  NEUTROABS  --   --  5.8  HGB 14.7 13.6 12.4*  HCT 45.7 39.2 37.5*  MCV 93.3 85.8 87.6  PLT 229 185 155   Basic Metabolic Panel: Recent Labs  Lab 11/03/17 1015  11/03/17 1658 11/03/17 2123 11/04/17 0330 11/04/17 1426 11/05/17 0553  NA 132*   < > 138 134* 139 139 139  K 5.1   < > 4.0 4.3 3.9 3.9 4.0  CL 97*   < > 108 106 110 112* 113*  CO2 <7*   < > 15* 18* 17* 14* 12*  GLUCOSE 742*   < > 257* 148* 152* 233* 173*  BUN 55*   < > 52* 44* 39* 30* 24*  CREATININE 2.84*   < > 1.90* 1.43* 1.25* 1.12 1.20  CALCIUM 8.5*   < > 8.6* 8.3* 8.4* 8.2* 8.1*  MG 2.6*  --   --   --   --  2.2 2.2   < > = values in this interval not displayed.    Liver Function Tests: Recent Labs  Lab 11/03/17 0724 11/03/17 1015 11/04/17 0330 11/05/17 0553  AST 31  --  27 23  ALT 22  --  19 18  ALKPHOS 98  --  71 69  BILITOT 3.4* 2.3* 1.1 1.8*  PROT 7.3  --  5.5* 5.3*  ALBUMIN 4.5  --  3.3* 2.9*   Recent Labs  Lab 11/03/17 0724  LIPASE 168*   No results for input(s): AMMONIA in the last 168 hours. Coagulation Profile: Recent Labs  Lab 11/04/17 0330  INR 1.26    Cardiac Enzymes: Recent Labs  Lab 11/03/17 1407 11/03/17 2123 11/04/17 0330 11/04/17 0913 11/04/17 1426  TROPONINI 0.60* 2.34* 1.83* 1.05* 0.71*   BNP (last 3 results) No results for input(s): PROBNP in the last 8760 hours. CBG: Recent Labs  Lab 11/05/17 0009 11/05/17 0358 11/05/17 0803 11/05/17 1139 11/05/17 1615  GLUCAP 163* 168* 154* 157* 111*   Studies: No results found.  Scheduled Meds: . aspirin EC  81 mg Oral Daily  . [START ON 11/06/2017] canagliflozin  100 mg Oral QAC breakfast  . diltiazem  90 mg Oral Q6H  . guaiFENesin  600 mg Oral BID  . insulin pump   Subcutaneous TID AC, HS, 0200  . metoprolol tartrate  25 mg Oral BID  . pantoprazole (PROTONIX) IV  40  mg Intravenous Q24H  . pravastatin  80 mg Oral Daily   Continuous Infusions: . sodium chloride 50 mL/hr at 11/05/17 1035  . ampicillin-sulbactam (UNASYN) IV Stopped (11/05/17 1206)   PRN Meds: acetaminophen **OR** acetaminophen, bisacodyl, HYDROcodone-acetaminophen, levalbuterol, metoCLOPramide (REGLAN) injection, metoprolol tartrate, senna-docusate  Time spent:  45 minutes   Author: Lynden OxfordPranav Meriam Chojnowski, MD [facility] Pager: [phone] 11/05/2017 4:22 PM  If 7PM-7AM, please contact night-coverage at [email or web address], password [email or web address]

## 2017-11-05 NOTE — Progress Notes (Signed)
Progress Note  Patient Name: Scott SchultzeJames H Andrzejewski Date of Encounter: 11/05/2017  Primary Cardiologist: Allyson SabalBerry  Subjective   Mr. Leavy CellaBoyd was admitted with DKA.  He did have mild troponin elevation with typical decline probably related to demand ischemia "type II non-STEMI".  He has had no chest pain or shortness of breath.  He did have an episode of PAF with RVR yesterday which broke with IV diltiazem.  Amiodarone was never begun.  He is on IV heparin as well.  Inpatient Medications    Scheduled Meds: . aspirin EC  81 mg Oral Daily  . guaiFENesin  600 mg Oral BID  . insulin aspart  0-9 Units Subcutaneous Q4H  . insulin glargine  15 Units Subcutaneous Daily  . metoprolol tartrate  25 mg Oral BID  . pantoprazole (PROTONIX) IV  40 mg Intravenous Q24H  . pravastatin  80 mg Oral Daily   Continuous Infusions: . sodium chloride    . ampicillin-sulbactam (UNASYN) IV Stopped (11/05/17 0732)  . diltiazem (CARDIZEM) infusion 5 mg/hr (11/05/17 0600)  . heparin 1,000 Units/hr (11/05/17 0600)   PRN Meds: acetaminophen **OR** acetaminophen, bisacodyl, HYDROcodone-acetaminophen, levalbuterol, metoCLOPramide (REGLAN) injection, metoprolol tartrate, senna-docusate   Vital Signs    Vitals:   11/05/17 0200 11/05/17 0400 11/05/17 0600 11/05/17 0802  BP: 134/62 (!) 136/58 (!) 132/59 (!) 141/64  Pulse: 88 92 98 96  Resp: (!) 21 (!) 26 (!) 22 (!) 23  Temp:  97.9 F (36.6 C)  99 F (37.2 C)  TempSrc:  Oral  Axillary  SpO2: 94% 95% 95% 96%  Weight:  192 lb 10.9 oz (87.4 kg)    Height:        Intake/Output Summary (Last 24 hours) at 11/05/2017 1014 Last data filed at 11/05/2017 0808 Gross per 24 hour  Intake 2970.91 ml  Output 4300 ml  Net -1329.09 ml   Filed Weights   11/03/17 1100 11/04/17 0323 11/05/17 0400  Weight: 194 lb 10.7 oz (88.3 kg) 192 lb 7.4 oz (87.3 kg) 192 lb 10.9 oz (87.4 kg)    Telemetry    Normal sinus rhythm, bundle branch block- Personally Reviewed  ECG    Not  performed today- Personally Reviewed  Physical Exam   GEN: No acute distress.   Neck: No JVD Cardiac: RRR, no murmurs, rubs, or gallops.  Respiratory: Clear to auscultation bilaterally. GI: Soft, nontender, non-distended  MS: No edema; No deformity. Neuro:  Nonfocal  Psych: Normal affect   Labs    Chemistry Recent Labs  Lab 11/03/17 0724 11/03/17 1015  11/04/17 0330 11/04/17 1426 11/05/17 0553  NA 132* 132*   < > 139 139 139  K 5.6* 5.1   < > 3.9 3.9 4.0  CL 91* 97*   < > 110 112* 113*  CO2 7* <7*   < > 17* 14* 12*  GLUCOSE 756* 742*   < > 152* 233* 173*  BUN 51* 55*   < > 39* 30* 24*  CREATININE 2.79* 2.84*   < > 1.25* 1.12 1.20  CALCIUM 9.4 8.5*   < > 8.4* 8.2* 8.1*  PROT 7.3  --   --  5.5*  --  5.3*  ALBUMIN 4.5  --   --  3.3*  --  2.9*  AST 31  --   --  27  --  23  ALT 22  --   --  19  --  18  ALKPHOS 98  --   --  71  --  69  BILITOT 3.4* 2.3*  --  1.1  --  1.8*  GFRNONAA 23* 23*   < > >60 >60 >60  GFRAA 27* 27*   < > >60 >60 >60  ANIONGAP 34* NOT CALCULATED   < > 12 13 14    < > = values in this interval not displayed.     Hematology Recent Labs  Lab 11/03/17 0724 11/04/17 0330 11/05/17 0553  WBC 21.7* 11.9* 7.3  RBC 4.90 4.57 4.28  HGB 14.7 13.6 12.4*  HCT 45.7 39.2 37.5*  MCV 93.3 85.8 87.6  MCH 30.0 29.8 29.0  MCHC 32.2 34.7 33.1  RDW 13.3 13.0 13.7  PLT 229 185 155    Cardiac Enzymes Recent Labs  Lab 11/03/17 2123 11/04/17 0330 11/04/17 0913 11/04/17 1426  TROPONINI 2.34* 1.83* 1.05* 0.71*    Recent Labs  Lab 11/03/17 0805  TROPIPOC 0.02     BNPNo results for input(s): BNP, PROBNP in the last 168 hours.   DDimer No results for input(s): DDIMER in the last 168 hours.   Radiology    Ct Abdomen Pelvis Wo Contrast  Result Date: 11/03/2017 CLINICAL DATA:  56 year old male with nausea, vomiting and upper abdominal pain. Elevated lipase. EXAM: CT ABDOMEN AND PELVIS WITHOUT CONTRAST TECHNIQUE: Multidetector CT imaging of the abdomen  and pelvis was performed following the standard protocol without IV contrast. COMPARISON:  None. FINDINGS: Lower chest: Patchy ground-glass attenuation airspace opacity and macro nodularity in a peribronchovascular distribution in the left lower and right middle lobes. No evidence of pleural effusion. Mild circumferential esophageal wall thickening. Oral contrast material is present within the visualized esophagus consistent with reflux. Hepatobiliary: The hepatic parenchyma is diffusely low in attenuation consistent with hepatic steatosis. High attenuation material in the gallbladder lumen suggests sludge and/ or small stones. No radiopaque calcified stones visualized. No intra or extrahepatic biliary ductal dilatation. Pancreas: Unremarkable. No pancreatic ductal dilatation or surrounding inflammatory changes. Spleen: Normal in size without focal abnormality. Adrenals/Urinary Tract: Adrenal glands are unremarkable. Kidneys are normal, without renal calculi, focal lesion, or hydronephrosis. The bladder is distended with urine. At 18.6 x 10.1 x 11.7 cm (volume = 1150 cm^3) Stomach/Bowel: No evidence of obstruction or focal bowel wall thickening. Normal appendix in the right lower quadrant. The terminal ileum is unremarkable. Vascular/Lymphatic: Limited evaluation in the absence of intravenous contrast. No aneurysm or significant atherosclerotic vascular calcifications. No suspicious lymphadenopathy. Reproductive: Prostate is unremarkable. Other: No abdominal wall hernia or abnormality. No abdominopelvic ascites. Musculoskeletal: No acute fracture or aggressive appearing lytic or blastic osseous lesion. IMPRESSION: 1. Patchy ground-glass attenuation airspace opacities and macro nodularity within the left lower and right middle lobes most consistent with multifocal pneumonia. 2. The no definitive imaging findings for acute pancreatitis. 3. Circumferential esophageal wall thickening. Oral contrast material is noted  throughout the visualized thoracic esophagus. Findings suggest reflux and likely esophagitis. 4. Bladder distention. There is an estimated a 1.15 L of volume within the bladder. 5. High attenuation material within the gallbladder lumen suggests the presence of sludge. No radiopaque stones identified. 6. Hepatic steatosis. Electronically Signed   By: Malachy MoanHeath  McCullough M.D.   On: 11/03/2017 13:43   Dg Chest 2 View  Result Date: 11/04/2017 CLINICAL DATA:  Lung crackles. EXAM: CHEST  2 VIEW COMPARISON:  Radiograph of November 03, 2017. FINDINGS: Stable cardiomediastinal silhouette. No pneumothorax is noted. Mild left lower lobe opacity is noted concerning for possible pneumonia. No significant pleural effusion is noted. Bony thorax is unremarkable. IMPRESSION: Mild  left lower lobe opacity is noted concerning for possible pneumonia. Electronically Signed   By: Lupita Raider, M.D.   On: 11/04/2017 10:30   Dg Chest Port 1 View  Result Date: 11/03/2017 CLINICAL DATA:  Pneumonia EXAM: PORTABLE CHEST 1 VIEW COMPARISON:  CT 11/03/2017 FINDINGS: Mild cardiomegaly. Previously noted lower lung infiltrates on CT are not well visualized radiographically. No pleural effusion. No pneumothorax. IMPRESSION: 1. Mild cardiomegaly 2. CT demonstrated lower lung infiltrates are not well seen on this single view chest. Electronically Signed   By: Jasmine Pang M.D.   On: 11/03/2017 16:13    Cardiac Studies   2D echocardiogram (11/04/17)  Study Conclusions  - Left ventricle: The cavity size was normal. There was mild focal   basal hypertrophy of the septum. Systolic function was normal.   The estimated ejection fraction was in the range of 60% to 65%.   Wall motion was normal; there were no regional wall motion   abnormalities. Doppler parameters are consistent with abnormal   left ventricular relaxation (grade 1 diastolic dysfunction).  Impressions:  - Normal LV systolic function; mild diastolic  dysfunction.   Patient Profile     56 y.o. male type I diabetic since age 16 admitted with DKA.  His troponins were mildly elevated and has since declined.  His 2D echo was normal.  He denies chest pain or shortness of breath.  On telemetry he did have A. fib with RVR which responded to IV diltiazem converting to sinus rhythm.  He is currently on IV heparin.  Assessment & Plan    1: Non-STEMI- troponins rose to approximately 2 and then decline.  I suspect this was demand ischemia related to his metabolic derangement.  His 2D echo was entirely normal.  I do not think this requires further workup.  2: PAF with RVR-patient went into PAF with RVR yesterday was placed on IV diltiazem subsequent conversion to sinus rhythm.  Amiodarone was suggested but never started.  He is on IV heparin.  I suspect this was related to his metabolic derangement.  I will convert his IV to long-acting p.o. Cardizem.  At this point, given no prior history of PAF I do not think he needs to be on long-term oral anticoagulation.  Patient is clinically stable.  We will see him back as needed.  Please call if you have any additional questions.  For questions or updates, please contact [facility] Please consult [email or web address] for contact info under Cardiology/STEMI.      Signed, Nanetta Batty, MD  11/05/2017, 10:14 AM

## 2017-11-06 DIAGNOSIS — E0810 Diabetes mellitus due to underlying condition with ketoacidosis without coma: Secondary | ICD-10-CM

## 2017-11-06 LAB — CBC
HCT: 37.6 % — ABNORMAL LOW (ref 39.0–52.0)
HEMOGLOBIN: 12.7 g/dL — AB (ref 13.0–17.0)
MCH: 29.7 pg (ref 26.0–34.0)
MCHC: 33.8 g/dL (ref 30.0–36.0)
MCV: 88.1 fL (ref 78.0–100.0)
Platelets: 130 10*3/uL — ABNORMAL LOW (ref 150–400)
RBC: 4.27 MIL/uL (ref 4.22–5.81)
RDW: 13.7 % (ref 11.5–15.5)
WBC: 5.4 10*3/uL (ref 4.0–10.5)

## 2017-11-06 LAB — BASIC METABOLIC PANEL
ANION GAP: 11 (ref 5–15)
BUN: 19 mg/dL (ref 6–20)
CHLORIDE: 109 mmol/L (ref 101–111)
CO2: 18 mmol/L — ABNORMAL LOW (ref 22–32)
Calcium: 8.2 mg/dL — ABNORMAL LOW (ref 8.9–10.3)
Creatinine, Ser: 0.86 mg/dL (ref 0.61–1.24)
GFR calc non Af Amer: >60 mL/min (ref >60)
Glucose, Bld: 123 mg/dL — ABNORMAL HIGH (ref 65–99)
POTASSIUM: 3.7 mmol/L (ref 3.5–5.1)
SODIUM: 138 mmol/L (ref 135–145)

## 2017-11-06 LAB — GLUCOSE, CAPILLARY
GLUCOSE-CAPILLARY: 104 mg/dL — AB (ref 65–99)
GLUCOSE-CAPILLARY: 121 mg/dL — AB (ref 65–99)
Glucose-Capillary: 106 mg/dL — ABNORMAL HIGH (ref 65–99)
Glucose-Capillary: 111 mg/dL — ABNORMAL HIGH (ref 65–99)
Glucose-Capillary: 118 mg/dL — ABNORMAL HIGH (ref 65–99)
Glucose-Capillary: 126 mg/dL — ABNORMAL HIGH (ref 65–99)
Glucose-Capillary: 83 mg/dL (ref 65–99)

## 2017-11-06 MED ORDER — BENZONATATE 100 MG PO CAPS
100.0000 mg | ORAL_CAPSULE | Freq: Three times a day (TID) | ORAL | Status: DC | PRN
Start: 2017-11-06 — End: 2017-11-07
  Administered 2017-11-06: 100 mg via ORAL
  Filled 2017-11-06: qty 1

## 2017-11-06 MED ORDER — GUAIFENESIN-DM 100-10 MG/5ML PO SYRP
5.0000 mL | ORAL_SOLUTION | ORAL | Status: DC | PRN
  Administered 2017-11-06 (×3): 5 mL via ORAL
  Filled 2017-11-06 (×3): qty 5

## 2017-11-06 MED ORDER — PANTOPRAZOLE SODIUM 40 MG PO TBEC
40.0000 mg | DELAYED_RELEASE_TABLET | Freq: Every day | ORAL | Status: DC
  Administered 2017-11-07: 40 mg via ORAL
  Filled 2017-11-06: qty 1

## 2017-11-06 MED ORDER — LEVOFLOXACIN 750 MG PO TABS
750.0000 mg | ORAL_TABLET | Freq: Every day | ORAL | Status: DC
  Administered 2017-11-06 – 2017-11-07 (×2): 750 mg via ORAL
  Filled 2017-11-06 (×2): qty 1

## 2017-11-06 NOTE — Progress Notes (Signed)
Pharmacy Antibiotic Note  Scott Mosley is a 56 y.o. male admitted on day #3 Unasyn for possible aspiration pneumonia. Now to change to oral Levaquin for CAP .     AKI resolved. Afebrile, WBC down to 5.4.  Urine culture pending. Unsure if sent. Has foley  Plan:  Levaquin 750 mg PO q24hrs.  Will follow renal function, length of therapy.  Height: 5\' 8"  (172.7 cm) Weight: 195 lb 1.7 oz (88.5 kg) IBW/kg (Calculated) : 68.4  Temp (24hrs), Avg:98.2 F (36.8 C), Min:97.5 F (36.4 C), Max:98.7 F (37.1 C)  Recent Labs  Lab 11/03/17 0724  11/03/17 1026 11/03/17 1407  11/03/17 2123 11/04/17 0330 11/04/17 1426 11/05/17 0553 11/06/17 0751  WBC 21.7*  --   --   --   --   --  11.9*  --  7.3 5.4  CREATININE 2.79*   < >  --  2.49*   < > 1.43* 1.25* 1.12 1.20 0.86  LATICACIDVEN  --   --  4.5* 2.4*  --   --   --   --   --   --    < > = values in this interval not displayed.    Estimated Creatinine Clearance: 101.2 mL/min (by C-G formula based on SCr of 0.86 mg/dL).    No Known Allergies  Antimicrobials this admission:  Unasyn 12/26>>12/28  Levaquin PO 12/28>>  Dose adjustments this admission:  n/a  Microbiology results:  12/25 urine - sent?  Thank you for allowing pharmacy to be a part of this patient's care.  Scott Mosley, Scott Mosley, [place] Pager: [phone] 11/06/2017 10:17 AM

## 2017-11-06 NOTE — Progress Notes (Signed)
[facility] Progress Note  Patient: Scott Mosley NWG:[id number]   PCP: Patient, No Pcp Per DOB: 03/02/1961   DOA: 11/03/2017   DOS: 11/06/2017   Date of Service: the patient was seen and examined on 11/06/2017  Subjective: Feeling better as compared to yesterday although not back to baseline.  Still fatigued and tired.  Breathing is better.  Cough still present.  No nausea no vomiting.  Brief hospital course: Pt. with PMH of type I DM, left eye blindness, HLD; admitted on 11/03/2017, presented with complaint of nausea and vomiting and not feeling well with weakness and lethargy, was found to have DKA, elevated troponin, A. fib with RVR, left lower lobe pneumonia. Currently further plan is continue current management.  Assessment and Plan: 1.  DKA. Type I DM, uncontrolled, hemoglobin A1c 7.7. Insulin pump malfunction. Initially anion gap metabolic acidosis. Now non-anion gap metabolic acidosis due to hyperchloremia. Diabetes coordinator consulted. Most likely insulin pump site failure causing the DKA, this is patient's first DKA in life. Received IV hydration, IV insulin. Patient is out of the DKA, anion gap has closed.   At patient request patient was started on basal bolus regimen on 11/04/2017. Transitioning to insulin pump, patient roughly gets 26 units of continuous insulin with 6-7 units of bolus pre-meal coverage. Changing the fluid to half normal saline. - start discharge planning, will obtain PT evaluation due to reported weakness  2.  Elevated troponin, type II non-STEMI. Cardiology consulted due to elevated troponin, thinks that this is demand ischemia. Echocardiogram shows preserved EF without any wall motion abnormalities. No further workup planned from cardiac decided. Currently signed off. - Pt transitioned today to oral antibiotics.  3.  Left lower lobe pneumonia. Acute hypoxic respiratory failure. Starting the patient on IV Unasyn for concern for possible  pneumonia. Monitor. Clinically getting better  4.  A. fib with RVR. Patient developed A. fib with RVR later during the day, started on IV Lopressor without any benefit, IV Cardizem infusion without any benefit. After discussing with the cardiology the patient is started on amiodarone drip which patient never received. Currently transitioning from IV Cardizem infusion to p.o. Cardizem. We will monitor and continue with IV hydration right now. Started back on heparin for anticoagulation on 11/04/2017, now discontinued since cardiology recommends no further long-term anticoagulant requirement for the patient.  5.  Essential hypertension. Patient is on ramipril at home, currently holding that in order to provide room for rate control medication.  Diet: carb modified diet DVT Prophylaxis: Heparin  Advance goals of care discussion: full code  Family Communication: family was present at bedside, at the time of interview. The pt provided permission to discuss medical plan with the family. Opportunity was given to ask question and all questions were answered satisfactorily.   Disposition:  Discharge to be determined.  PT OT consulted  Consultants: cardiology Procedures: Echocardiogram   Antibiotics: Anti-infectives (From admission, onward)   Start     Dose/Rate Route Frequency Ordered Stop   11/06/17 1200  levofloxacin (LEVAQUIN) tablet 750 mg     750 mg Oral Daily 11/06/17 1004     11/04/17 1500  Ampicillin-Sulbactam (UNASYN) 3 g in sodium chloride 0.9 % 100 mL IVPB  Status:  Discontinued     3 g 200 mL/hr over 30 Minutes Intravenous Every 6 hours 11/04/17 1459 11/06/17 0850       Objective: Physical Exam: Vitals:   11/06/17 0400 11/06/17 0808 11/06/17 1130 11/06/17 1604  BP: 129/61 (!) 157/59 Marland Kitchen)  141/63 (!) 142/66  Pulse: 77 88 83 83  Resp: 20 (!) 26 (!) 27 15  Temp: 98.7 F (37.1 C) 98 F (36.7 C) 98 F (36.7 C) 98.8 F (37.1 C)  TempSrc: Oral Oral Oral Oral  SpO2: 95%  94% 95% 97%  Weight: 88.5 kg (195 lb 1.7 oz)     Height:        Intake/Output Summary (Last 24 hours) at 11/06/2017 1612 Last data filed at 11/06/2017 1524 Gross per 24 hour  Intake 925 ml  Output 1850 ml  Net -925 ml   Filed Weights   11/04/17 0323 11/05/17 0400 11/06/17 0400  Weight: 87.3 kg (192 lb 7.4 oz) 87.4 kg (192 lb 10.9 oz) 88.5 kg (195 lb 1.7 oz)   General: Awake and alert, in NAD Eyes: left eye blind ness, Conjunctiva normal ENT: Oral Mucosa clear moist. Neck: no JVD, no Abnormal Mass Or lumps Cardiovascular: S1 and S2 Present, no Murmur, Peripheral Pulses Present Respiratory: increased respiratory effort, Bilateral Air entry equal and Decreased, positive use of accessory muscle, left Crackles, no wheezes Abdomen: Bowel Sound present, Soft and no tenderness, no hernia Skin: no redness, no Rash, no induration Extremities: no Pedal edema, no calf tenderness Neurologic: Grossly no focal neuro deficit. Bilaterally Equal motor strength  Data Reviewed: CBC: Recent Labs  Lab 11/03/17 0724 11/04/17 0330 11/05/17 0553 11/06/17 0751  WBC 21.7* 11.9* 7.3 5.4  NEUTROABS  --   --  5.8  --   HGB 14.7 13.6 12.4* 12.7*  HCT 45.7 39.2 37.5* 37.6*  MCV 93.3 85.8 87.6 88.1  PLT 229 185 155 130*   Basic Metabolic Panel: Recent Labs  Lab 11/03/17 1015  11/03/17 2123 11/04/17 0330 11/04/17 1426 11/05/17 0553 11/06/17 0751  NA 132*   < > 134* 139 139 139 138  K 5.1   < > 4.3 3.9 3.9 4.0 3.7  CL 97*   < > 106 110 112* 113* 109  CO2 <7*   < > 18* 17* 14* 12* 18*  GLUCOSE 742*   < > 148* 152* 233* 173* 123*  BUN 55*   < > 44* 39* 30* 24* 19  CREATININE 2.84*   < > 1.43* 1.25* 1.12 1.20 0.86  CALCIUM 8.5*   < > 8.3* 8.4* 8.2* 8.1* 8.2*  MG 2.6*  --   --   --  2.2 2.2  --    < > = values in this interval not displayed.    Liver Function Tests: Recent Labs  Lab 11/03/17 0724 11/03/17 1015 11/04/17 0330 11/05/17 0553  AST 31  --  27 23  ALT 22  --  19 18  ALKPHOS  98  --  71 69  BILITOT 3.4* 2.3* 1.1 1.8*  PROT 7.3  --  5.5* 5.3*  ALBUMIN 4.5  --  3.3* 2.9*   Recent Labs  Lab 11/03/17 0724  LIPASE 168*   No results for input(s): AMMONIA in the last 168 hours. Coagulation Profile: Recent Labs  Lab 11/04/17 0330  INR 1.26   Cardiac Enzymes: Recent Labs  Lab 11/03/17 1407 11/03/17 2123 11/04/17 0330 11/04/17 0913 11/04/17 1426  TROPONINI 0.60* 2.34* 1.83* 1.05* 0.71*   BNP (last 3 results) No results for input(s): PROBNP in the last 8760 hours. CBG: Recent Labs  Lab 11/06/17 0304 11/06/17 0520 11/06/17 0808 11/06/17 1153 11/06/17 1605  GLUCAP 106* 104* 118* 121* 126*   Studies: No results found.  Scheduled Meds: . aspirin EC  81 mg Oral Daily  . diltiazem  90 mg Oral Q6H  . guaiFENesin  600 mg Oral BID  . insulin pump   Subcutaneous TID AC, HS, 0200  . levofloxacin  750 mg Oral Daily  . metoprolol tartrate  25 mg Oral BID  . pantoprazole  40 mg Oral Daily  . pravastatin  80 mg Oral Daily   Continuous Infusions: . sodium chloride 50 mL/hr at 11/06/17 0719   PRN Meds: acetaminophen **OR** acetaminophen, bisacodyl, guaiFENesin-dextromethorphan, HYDROcodone-acetaminophen, levalbuterol, metoCLOPramide (REGLAN) injection, metoprolol tartrate, senna-docusate  Time spent:  45 minutes   Author: Penny PiaVEGA, Jessye Imhoff [facility] Pager: [phone] 11/06/2017 4:12 PM  If 7PM-7AM, please contact night-coverage at [email or web address], password [email or web address]

## 2017-11-06 NOTE — Evaluation (Signed)
Physical Therapy Evaluation Patient Details Name: Scott Mosley MRN: [id number] DOB: 05-04-1961 Today's Date: 11/06/2017   History of Present Illness  56yo male who experienced nausea/malaise/weakness when out hunting; he presented to the ED and was diagnosed with diabetic ketoacidosis with glucose at 756; over the course of his stay he developed pneumonia, A-fib with RVR as well. PMH includes DM and L eye blindness.   Clinical Impression  Patient received in room with family present, pleasant and willing to participate with skilled PT services. Patient generally able to complete all functional mobility, transfers, and gait with S to Min guard, no significant gait deficits or balance deviations noted at evaluation. Note L eye blindness and limited peripheral vision R eye however this does not appear to impact his balance at this time. Overall patient is moving very well and appears appropriate for skilled HHPT services moving forward. Patient left up in chair with family present, all questions/concerns addressed and needs met.      Follow Up Recommendations Home health PT    Equipment Recommendations  3in1 (PT)    Recommendations for Other Services       Precautions / Restrictions Precautions Precautions: Fall;Other (comment) Precaution Comments: L eye blind, R eye with limited peripheral vision  Restrictions Weight Bearing Restrictions: No      Mobility  Bed Mobility Overal bed mobility: Needs Assistance Bed Mobility: Supine to Sit     Supine to sit: Supervision     General bed mobility comments: S for safety and line management   Transfers Overall transfer level: Needs assistance Equipment used: None Transfers: Sit to/from Stand Sit to Stand: Min guard         General transfer comment: min guard for safety, no significant safety or balance deficits noted   Ambulation/Gait Ambulation/Gait assistance: Min guard Ambulation Distance (Feet): 80 Feet Assistive device:  None Gait Pattern/deviations: WFL(Within Functional Limits)     General Gait Details: no significant gait or balance deficit noted, gait appears generally [facility]   Stairs            Wheelchair Mobility    Modified Rankin (Stroke Patients Only)       Balance Overall balance assessment: No apparent balance deficits (not formally assessed)                                           Pertinent Vitals/Pain Pain Assessment: No/denies pain    Home Living Family/patient expects to be discharged to:: Private residence Living Arrangements: Spouse/significant other Available Help at Discharge: Family Type of Home: House Home Access: Stairs to enter Entrance Stairs-Rails: Right Entrance Stairs-Number of Steps: 7 Home Layout: One level Home Equipment: None      Prior Function Level of Independence: Independent               Hand Dominance        Extremity/Trunk Assessment   Upper Extremity Assessment Upper Extremity Assessment: Overall WFL for tasks assessed    Lower Extremity Assessment Lower Extremity Assessment: Overall WFL for tasks assessed    Cervical / Trunk Assessment Cervical / Trunk Assessment: Normal  Communication   Communication: No difficulties  Cognition Arousal/Alertness: Awake/alert Behavior During Therapy: WFL for tasks assessed/performed Overall Cognitive Status: Within Functional Limits for tasks assessed  General Comments      Exercises     Assessment/Plan    PT Assessment Patient needs continued PT services  PT Problem List Decreased mobility;Decreased strength;Decreased coordination;Decreased balance       PT Treatment Interventions Therapeutic activities;Gait training;Therapeutic exercise;Patient/family education;Stair training;Balance training;Functional mobility training;Neuromuscular re-education    PT Goals (Current goals can be found in the Care  Plan section)  Acute Rehab PT Goals Patient Stated Goal: to feel better, go home  PT Goal Formulation: With patient/family Time For Goal Achievement: 11/13/17 Potential to Achieve Goals: Good    Frequency Min 4X/week   Barriers to discharge        Co-evaluation               AM-PAC PT "6 Clicks" Daily Activity  Outcome Measure Difficulty turning over in bed (including adjusting bedclothes, sheets and blankets)?: None Difficulty moving from lying on back to sitting on the side of the bed? : None Difficulty sitting down on and standing up from a chair with arms (e.g., wheelchair, bedside commode, etc,.)?: None Help needed moving to and from a bed to chair (including a wheelchair)?: None Help needed walking in hospital room?: None Help needed climbing 3-5 steps with a railing? : A Little 6 Click Score: 23    End of Session Equipment Utilized During Treatment: Gait belt Activity Tolerance: Patient tolerated treatment well Patient left: in chair;with call bell/phone within reach;with family/visitor present   PT Visit Diagnosis: Unsteadiness on feet (R26.81);Other abnormalities of gait and mobility (R26.89)    Time: 1135-1200 PT Time Calculation (min) (ACUTE ONLY): 25 min   Charges:   PT Evaluation $PT Eval Moderate Complexity: 1 Mod PT Treatments $Self Care/Home Management: 8-22   PT G Codes:   PT G-Codes **NOT FOR INPATIENT CLASS** Functional Assessment Tool Used: AM-PAC 6 Clicks Basic Mobility;Clinical judgement Functional Limitation: Mobility: Walking and moving around Mobility: Walking and Moving Around Current Status (W5462): At least 1 percent but less than 20 percent impaired, limited or restricted Mobility: Walking and Moving Around Goal Status [phone]): At least 1 percent but less than 20 percent impaired, limited or restricted    Deniece Ree PT, DPT, CBIS  Supplemental Physical Therapist [facility]

## 2017-11-06 NOTE — Progress Notes (Signed)
    Telemetry reviewed, patient remains in SR. Can arrange for cardiology follow up at the time of discharge. Please call if any questions.   SignedLaverda Page, Joncarlos Atkison, NP-C 11/06/2017, 9:38 AM Pager: [phone]

## 2017-11-07 LAB — GLUCOSE, CAPILLARY
Glucose-Capillary: 120 mg/dL — ABNORMAL HIGH (ref 65–99)
Glucose-Capillary: 106 mg/dL — ABNORMAL HIGH (ref 65–99)
Glucose-Capillary: 140 mg/dL — ABNORMAL HIGH (ref 65–99)

## 2017-11-07 MED ORDER — HYDROCOD POLST-CPM POLST ER 10-8 MG/5ML PO SUER: 5.0000 mL | Freq: Two times a day (BID) | ORAL | 0 refills | Status: DC | PRN

## 2017-11-07 MED ORDER — DILTIAZEM HCL 90 MG PO TABS: 90.0000 mg | ORAL_TABLET | Freq: Four times a day (QID) | ORAL | 0 refills | Status: DC

## 2017-11-07 MED ORDER — LEVOFLOXACIN 750 MG PO TABS: 750.0000 mg | ORAL_TABLET | Freq: Every day | ORAL | 0 refills | Status: AC

## 2017-11-07 MED ORDER — METOPROLOL TARTRATE 25 MG PO TABS: 25.0000 mg | ORAL_TABLET | Freq: Two times a day (BID) | ORAL | 0 refills | Status: DC

## 2017-11-07 NOTE — Discharge Summary (Signed)
Physician Discharge Summary  Scott SchultzeJames H Mosley WUJ:[id number] DOB: 1961/11/06 DOA: 11/03/2017  PCP: Patient, No Pcp Per  Admit date: 11/03/2017 Discharge date: 11/07/2017  Time spent: > 35 minutes  Recommendations for Outpatient Follow-up:  1. Recommend patient follow up with cardiologist   Discharge Diagnoses:  Active Problems:   DKA (diabetic ketoacidoses) (HCC)   Hyperlipemia   Leukocytosis   Nausea and vomiting   Elevated lipase   Elevated bilirubin   Discharge Condition: stable  Diet recommendation: Heart healthy/ carb modified.  Filed Weights   11/05/17 0400 11/06/17 0400 11/07/17 0311  Weight: 87.4 kg (192 lb 10.9 oz) 88.5 kg (195 lb 1.7 oz) 88.5 kg (195 lb 1.7 oz)    History of present illness:  Pt. with PMH of type I DM, left eye blindness, HLD; admitted on 11/03/2017, presented with complaint of nausea and vomiting and not feeling well with weakness and lethargy, was found to have DKA, elevated troponin, A. fib with RVR, left lower lobe pneumonia.  Hospital Course:  1.  DKA. Type I DM, uncontrolled, hemoglobin A1c 7.7. Insulin pump malfunction. Initially anion gap metabolic acidosis. Now non-anion gap metabolic acidosis due to hyperchloremia. Diabetes coordinator consulted. Most likely insulin pump site failure causing the DKA, this is patient's first DKA in life. Received IV hydration, IV insulin. Patient is out of the DKA, anion gap has closed.   Transitioned to insulin pump, patient roughly gets 26 units of continuous insulin with 6-7 units of bolus pre-meal coverage. - PT recommended home health PT but patient has refused.  2.  Elevated troponin, type II non-STEMI. Cardiology consulted due to elevated troponin, thinks that this is demand ischemia. Echocardiogram shows preserved EF without any wall motion abnormalities. No further workup planned from cardiac decided. Currently signed off.  3.  Left lower lobe pneumonia. Acute hypoxic respiratory  failure. Starting the patient on IV Unasyn due to concern for possible pneumonia. - Was transitioned to levaquin and showed persistent improvement - Pt requested tussionex for cough medication stating the tessalon pearles did not help  4.  A. fib with RVR. Patient developed A. fib with RVR later during the day, started on IV Lopressor without any benefit, IV Cardizem infusion without any benefit. After discussing with the cardiology the patient is started on amiodarone drip which patient never received. Currently transitioned from IV Cardizem infusion to p.o. Cardizem. Started back on heparin for anticoagulation on 11/04/2017, now discontinued since cardiology recommends no further long-term anticoagulant requirement for the patient.  5.  Essential hypertension. Patient is on ramipril at home, currently holding that in order to provide room for rate control medication. - Will have cardiologist on post hospital follow up decide when to continue ACEI  Procedures:  Echocardiogram  Study Conclusions  - Left ventricle: The cavity size was normal. There was mild focal basal hypertrophy of the septum. Systolic function was normal. The estimated ejection fraction was in the range of 60% to 65%. Wall motion was normal; there were no regional wall motion abnormalities. Doppler parameters are consistent with abnormal left ventricular relaxation (grade 1 diastolic dysfunction).  Impressions:  - Normal LV systolic function; mild diastolic dysfunction.    Consultations:  Cardiology  Discharge Exam: Vitals:   11/07/17 0358 11/07/17 0925  BP: 134/60 (!) 147/55  Pulse:  94  Resp:  (!) 22  Temp:  97.9 F (36.6 C)  SpO2:  92%    General: Pt in nad, alert and awake Cardiovascular: s1 and s2 present, no rubs Respiratory:  no increased wob, no wheezes  Discharge Instructions   Discharge Instructions    Call MD for:  severe uncontrolled pain   Complete by:  As  directed    Call MD for:  temperature >100.4   Complete by:  As directed    Diet - low sodium heart healthy   Complete by:  As directed    Discharge instructions   Complete by:  As directed    Please follow up with your Cardiologist within the next 2 weeks or sooner should any new concerns arise.   Increase activity slowly   Complete by:  As directed      Allergies as of 11/07/2017   No Known Allergies     Medication List    STOP taking these medications   meloxicam 15 MG tablet Commonly known as:  MOBIC   ramipril 10 MG capsule Commonly known as:  ALTACE     TAKE these medications   aspirin EC 81 MG tablet Take 81 mg by mouth daily.   chlorpheniramine-HYDROcodone 10-8 MG/5ML Suer Commonly known as:  TUSSIONEX PENNKINETIC ER Take 5 mLs by mouth every 12 (twelve) hours as needed for cough.   diltiazem 90 MG tablet Commonly known as:  CARDIZEM Take 1 tablet (90 mg total) by mouth every 6 (six) hours.   HUMALOG 100 UNIT/ML injection Generic drug:  insulin lispro Inject into the skin 3 (three) times daily before meals. Cont- insulin pump   INVOKANA 100 MG Tabs tablet Generic drug:  canagliflozin Take by mouth daily before breakfast.   levofloxacin 750 MG tablet Commonly known as:  LEVAQUIN Take 1 tablet (750 mg total) by mouth daily.   metoprolol tartrate 25 MG tablet Commonly known as:  LOPRESSOR Take 1 tablet (25 mg total) by mouth 2 (two) times daily.   pravastatin 80 MG tablet Commonly known as:  PRAVACHOL Take 80 mg by mouth daily.            Durable Medical Equipment  (From admission, onward)        Start     Ordered   11/07/17 0852  For home use only DME 3 n 1  Once     11/07/17 0851     No Known Allergies    The results of significant diagnostics from this hospitalization (including imaging, microbiology, ancillary and laboratory) are listed below for reference.    Significant Diagnostic Studies: Ct Abdomen Pelvis Wo  Contrast  Result Date: 11/03/2017 CLINICAL DATA:  56 year old male with nausea, vomiting and upper abdominal pain. Elevated lipase. EXAM: CT ABDOMEN AND PELVIS WITHOUT CONTRAST TECHNIQUE: Multidetector CT imaging of the abdomen and pelvis was performed following the standard protocol without IV contrast. COMPARISON:  None. FINDINGS: Lower chest: Patchy ground-glass attenuation airspace opacity and macro nodularity in a peribronchovascular distribution in the left lower and right middle lobes. No evidence of pleural effusion. Mild circumferential esophageal wall thickening. Oral contrast material is present within the visualized esophagus consistent with reflux. Hepatobiliary: The hepatic parenchyma is diffusely low in attenuation consistent with hepatic steatosis. High attenuation material in the gallbladder lumen suggests sludge and/ or small stones. No radiopaque calcified stones visualized. No intra or extrahepatic biliary ductal dilatation. Pancreas: Unremarkable. No pancreatic ductal dilatation or surrounding inflammatory changes. Spleen: Normal in size without focal abnormality. Adrenals/Urinary Tract: Adrenal glands are unremarkable. Kidneys are normal, without renal calculi, focal lesion, or hydronephrosis. The bladder is distended with urine. At 18.6 x 10.1 x 11.7 cm (volume = 1150 cm^3) Stomach/Bowel:  No evidence of obstruction or focal bowel wall thickening. Normal appendix in the right lower quadrant. The terminal ileum is unremarkable. Vascular/Lymphatic: Limited evaluation in the absence of intravenous contrast. No aneurysm or significant atherosclerotic vascular calcifications. No suspicious lymphadenopathy. Reproductive: Prostate is unremarkable. Other: No abdominal wall hernia or abnormality. No abdominopelvic ascites. Musculoskeletal: No acute fracture or aggressive appearing lytic or blastic osseous lesion. IMPRESSION: 1. Patchy ground-glass attenuation airspace opacities and macro nodularity  within the left lower and right middle lobes most consistent with multifocal pneumonia. 2. The no definitive imaging findings for acute pancreatitis. 3. Circumferential esophageal wall thickening. Oral contrast material is noted throughout the visualized thoracic esophagus. Findings suggest reflux and likely esophagitis. 4. Bladder distention. There is an estimated a 1.15 L of volume within the bladder. 5. High attenuation material within the gallbladder lumen suggests the presence of sludge. No radiopaque stones identified. 6. Hepatic steatosis. Electronically Signed   By: Malachy Moan M.D.   On: 11/03/2017 13:43   Dg Chest 2 View  Result Date: 11/04/2017 CLINICAL DATA:  Lung crackles. EXAM: CHEST  2 VIEW COMPARISON:  Radiograph of November 03, 2017. FINDINGS: Stable cardiomediastinal silhouette. No pneumothorax is noted. Mild left lower lobe opacity is noted concerning for possible pneumonia. No significant pleural effusion is noted. Bony thorax is unremarkable. IMPRESSION: Mild left lower lobe opacity is noted concerning for possible pneumonia. Electronically Signed   By: Lupita Raider, M.D.   On: 11/04/2017 10:30   Dg Chest Port 1 View  Result Date: 11/03/2017 CLINICAL DATA:  Pneumonia EXAM: PORTABLE CHEST 1 VIEW COMPARISON:  CT 11/03/2017 FINDINGS: Mild cardiomegaly. Previously noted lower lung infiltrates on CT are not well visualized radiographically. No pleural effusion. No pneumothorax. IMPRESSION: 1. Mild cardiomegaly 2. CT demonstrated lower lung infiltrates are not well seen on this single view chest. Electronically Signed   By: Jasmine Pang M.D.   On: 11/03/2017 16:13    Microbiology: No results found for this or any previous visit (from the past 240 hour(s)).   Labs: Basic Metabolic Panel: Recent Labs  Lab 11/03/17 1015  11/03/17 2123 11/04/17 0330 11/04/17 1426 11/05/17 0553 11/06/17 0751  NA 132*   < > 134* 139 139 139 138  K 5.1   < > 4.3 3.9 3.9 4.0 3.7  CL 97*    < > 106 110 112* 113* 109  CO2 <7*   < > 18* 17* 14* 12* 18*  GLUCOSE 742*   < > 148* 152* 233* 173* 123*  BUN 55*   < > 44* 39* 30* 24* 19  CREATININE 2.84*   < > 1.43* 1.25* 1.12 1.20 0.86  CALCIUM 8.5*   < > 8.3* 8.4* 8.2* 8.1* 8.2*  MG 2.6*  --   --   --  2.2 2.2  --    < > = values in this interval not displayed.   Liver Function Tests: Recent Labs  Lab 11/03/17 0724 11/03/17 1015 11/04/17 0330 11/05/17 0553  AST 31  --  27 23  ALT 22  --  19 18  ALKPHOS 98  --  71 69  BILITOT 3.4* 2.3* 1.1 1.8*  PROT 7.3  --  5.5* 5.3*  ALBUMIN 4.5  --  3.3* 2.9*   Recent Labs  Lab 11/03/17 0724  LIPASE 168*   No results for input(s): AMMONIA in the last 168 hours. CBC: Recent Labs  Lab 11/03/17 0724 11/04/17 0330 11/05/17 0553 11/06/17 0751  WBC 21.7* 11.9* 7.3 5.4  NEUTROABS  --   --  5.8  --   HGB 14.7 13.6 12.4* 12.7*  HCT 45.7 39.2 37.5* 37.6*  MCV 93.3 85.8 87.6 88.1  PLT 229 185 155 130*   Cardiac Enzymes: Recent Labs  Lab 11/03/17 1407 11/03/17 2123 11/04/17 0330 11/04/17 0913 11/04/17 1426  TROPONINI 0.60* 2.34* 1.83* 1.05* 0.71*   BNP: BNP (last 3 results) No results for input(s): BNP in the last 8760 hours.  ProBNP (last 3 results) No results for input(s): PROBNP in the last 8760 hours.  CBG: Recent Labs  Lab 11/06/17 1605 11/06/17 1954 11/06/17 2334 11/07/17 0357 11/07/17 0814  GLUCAP 126* 83 111* 120* 106*       Signed:  Penny PiaVEGA, Rajah Lamba MD.  [facility] 11/07/2017, 10:46 AM

## 2017-11-07 NOTE — Progress Notes (Signed)
Pt discharged from hospital, IV's removed (dry, dry, intact), Foley catheter removed (per Dr. Kirby CriglerVega's order), pt voided post Foley removal.  Vital signs stable at discharge.

## 2017-11-09 DIAGNOSIS — E119 Type 2 diabetes mellitus without complications: Secondary | ICD-10-CM | POA: Insufficient documentation

## 2017-11-13 DIAGNOSIS — R7989 Other specified abnormal findings of blood chemistry: Secondary | ICD-10-CM | POA: Diagnosis not present

## 2017-11-13 DIAGNOSIS — E1169 Type 2 diabetes mellitus with other specified complication: Secondary | ICD-10-CM | POA: Diagnosis not present

## 2017-11-13 DIAGNOSIS — N179 Acute kidney failure, unspecified: Secondary | ICD-10-CM | POA: Diagnosis not present

## 2017-11-13 DIAGNOSIS — I480 Paroxysmal atrial fibrillation: Secondary | ICD-10-CM | POA: Diagnosis not present

## 2017-11-14 DIAGNOSIS — E108 Type 1 diabetes mellitus with unspecified complications: Secondary | ICD-10-CM | POA: Diagnosis not present

## 2017-11-20 ENCOUNTER — Encounter: Payer: Self-pay | Admitting: Cardiovascular Disease

## 2017-11-20 ENCOUNTER — Ambulatory Visit: Payer: Medicare Other | Admitting: Cardiovascular Disease

## 2017-11-20 DIAGNOSIS — E7800 Pure hypercholesterolemia, unspecified: Secondary | ICD-10-CM | POA: Diagnosis not present

## 2017-11-20 MED ORDER — METOPROLOL TARTRATE 25 MG PO TABS: 25.0000 mg | ORAL_TABLET | Freq: Two times a day (BID) | ORAL | 6 refills | Status: DC

## 2017-11-20 NOTE — Patient Instructions (Signed)
Medication Instructions: Your physician recommends that you continue on your current medications as directed. Please refer to the Current Medication list given to you today.  Discontinue Cardizem  Restart Ramipril 10 mg daily.   Follow-Up: Your physician wants you to follow-up in: 1 year with Dr. Allyson SabalBerry. You will receive a reminder letter in the mail two months in advance. If you don't receive a letter, please call our office to schedule the follow-up appointment.  If you need a refill on your cardiac medications before your next appointment, please call your pharmacy.

## 2017-11-20 NOTE — Assessment & Plan Note (Signed)
History of hyperlipidemia on statin therapy followed by his PCP 

## 2017-11-20 NOTE — Progress Notes (Signed)
11/20/2017 Scott SchultzeJames H Luevanos   1961/05/27  [id number]  Primary Physician Adrian PrinceSouth, Stephen, MD Primary Cardiologist: Runell GessJonathan J Onix Jumper MD Nicholes CalamityFACP, FACC, FAHA, [place]  HPI:  Scott SchultzeJames H Mosley is a 57 y.o. married Caucasian male father of 2, grandfather and one grandchild is accompanied by his wife Terrie today. He was recently hospitalized 11/03/17 for 4 days with DKA and mild troponin leak along with brief episode of PAF with RVR. So the problems include type 1 diabetes since age 57 on an insulin pump and hyperlipidemia on statin therapy followed by his PCP. Apparently he had an insulin pump malfunction which may have contributed to his hospitalization. He was discharged home on Cardizem and metoprolol and his ACE inhibitor was discontinued. Since discharge she is starting to regain his strength. He denies chest pain or shortness of breath. Never had a heart attack or stroke. His father did have a myocardial infarction. A 2-D echo performed during his hospitalization was essentially normal.   Current Meds  Medication Sig  . aspirin EC 81 MG tablet Take 81 mg by mouth daily.  . canagliflozin (INVOKANA) 100 MG TABS tablet Take by mouth daily before breakfast.  . chlorpheniramine-HYDROcodone (TUSSIONEX PENNKINETIC ER) 10-8 MG/5ML SUER Take 5 mLs by mouth every 12 (twelve) hours as needed for cough.  . insulin lispro (HUMALOG) 100 UNIT/ML injection Inject into the skin 3 (three) times daily before meals. Cont- insulin pump  . levofloxacin (LEVAQUIN) 750 MG tablet Take 1 tablet (750 mg total) by mouth daily.  . metoprolol tartrate (LOPRESSOR) 25 MG tablet Take 1 tablet (25 mg total) by mouth 2 (two) times daily.  . pravastatin (PRAVACHOL) 80 MG tablet Take 80 mg by mouth daily.  . [DISCONTINUED] diltiazem (CARDIZEM) 90 MG tablet Take 1 tablet (90 mg total) by mouth every 6 (six) hours.     No Known Allergies  Social History   Socioeconomic History  . Marital status: Married    Spouse name: Not on file  .  Number of children: Not on file  . Years of education: Not on file  . Highest education level: Not on file  Social Needs  . Financial resource strain: Not on file  . Food insecurity - worry: Not on file  . Food insecurity - inability: Not on file  . Transportation needs - medical: Not on file  . Transportation needs - non-medical: Not on file  Occupational History  . Not on file  Tobacco Use  . Smoking status: Never Smoker  . Smokeless tobacco: Never Used  Substance and Sexual Activity  . Alcohol use: No    Alcohol/week: 0.0 oz  . Drug use: No  . Sexual activity: No  Other Topics Concern  . Not on file  Social History Narrative  . Not on file     Review of Systems: General: negative for chills, fever, night sweats or weight changes.  Cardiovascular: negative for chest pain, dyspnea on exertion, edema, orthopnea, palpitations, paroxysmal nocturnal dyspnea or shortness of breath Dermatological: negative for rash Respiratory: negative for cough or wheezing Urologic: negative for hematuria Abdominal: negative for nausea, vomiting, diarrhea, bright red blood per rectum, melena, or hematemesis Neurologic: negative for visual changes, syncope, or dizziness All other systems reviewed and are otherwise negative except as noted above.    Blood pressure 121/65, pulse 72, height 5\' 8"  (1.727 m), weight 192 lb (87.1 kg), SpO2 96 %.  General appearance: alert and no distress Neck: no adenopathy, no carotid bruit, no  JVD, supple, symmetrical, trachea midline and thyroid not enlarged, symmetric, no tenderness/mass/nodules Lungs: clear to auscultation bilaterally Heart: regular rate and rhythm, S1, S2 normal, no murmur, click, rub or gallop Extremities: extremities normal, atraumatic, no cyanosis or edema Pulses: 2+ and symmetric Skin: Skin color, texture, turgor normal. No rashes or lesions Neurologic: Alert and oriented X 3, normal strength and tone. Normal symmetric reflexes. Normal  coordination and gait  EKG not performed today  ASSESSMENT AND PLAN:   Hyperlipemia History of hyperlipidemia on statin therapy followed by his PCP      Runell Gess MD [facility], [facility] 11/20/2017 4:52 PM

## 2017-11-20 NOTE — Addendum Note (Signed)
Addended by: Evans LanceSTOVER, Chananya Canizalez W on: 11/20/2017 04:56 PM   Modules accepted: Orders

## 2017-11-30 ENCOUNTER — Telehealth: Payer: Self-pay | Admitting: Cardiovascular Disease

## 2017-11-30 NOTE — Telephone Encounter (Signed)
Returned call to patient's wife - patient saw Dr. Allyson SabalBerry on Jan 11 for new appt after hospitalization for DKA + PAF. Wife is concerned about patient's med changes as recommended by MD - particularly restarting ramipril since he is on lopressor. She reports patient was only on ramipril for his kidneys (not for high BP) and was not lopressor prior to hospitalization. Patient passed out while showering a few weeks ago while on diltiazem & metoprolol tartrate (this was not mentioned at the appointment). Wife states his blood sugar was fine after this incident so she does not think this episode was diabetes-mediated.  Patient has a home wrist cuff but does not monitor home BP but reports BP is fine at his doctor's office visits.  Advised would defer to MD to review patient's concerns and advise.

## 2017-11-30 NOTE — Telephone Encounter (Signed)
New message ° °Pt wife verbalized that she is calling for the RN °

## 2017-12-01 NOTE — Telephone Encounter (Signed)
Have patient come back to see Belenda CruiseKristin for medicine reconciliation

## 2017-12-02 ENCOUNTER — Telehealth: Payer: Self-pay | Admitting: Cardiovascular Disease

## 2017-12-02 NOTE — Telephone Encounter (Signed)
New message ° °Pt wife verbalized that she is returning call for RN °

## 2017-12-02 NOTE — Telephone Encounter (Signed)
Called patient to schedule appointment for medication reconciliation, but, patient could not decide on a day as he is having issues with his eyes so he cannot drive and would need his wife to drive him.  I told him to call back when he talks to his wife and schedule it then.

## 2017-12-02 NOTE — Telephone Encounter (Signed)
Returned call to patient's wife.Dr.Berry's recommendation given.Advised scheduler will call back with appointment.

## 2017-12-03 DIAGNOSIS — H4311 Vitreous hemorrhage, right eye: Secondary | ICD-10-CM | POA: Diagnosis not present

## 2017-12-03 DIAGNOSIS — E1039 Type 1 diabetes mellitus with other diabetic ophthalmic complication: Secondary | ICD-10-CM | POA: Diagnosis not present

## 2017-12-03 DIAGNOSIS — H35371 Puckering of macula, right eye: Secondary | ICD-10-CM | POA: Diagnosis not present

## 2017-12-03 DIAGNOSIS — E103591 Type 1 diabetes mellitus with proliferative diabetic retinopathy without macular edema, right eye: Secondary | ICD-10-CM | POA: Diagnosis not present

## 2017-12-07 DIAGNOSIS — J189 Pneumonia, unspecified organism: Secondary | ICD-10-CM | POA: Diagnosis not present

## 2017-12-11 DIAGNOSIS — E103591 Type 1 diabetes mellitus with proliferative diabetic retinopathy without macular edema, right eye: Secondary | ICD-10-CM | POA: Diagnosis not present

## 2017-12-11 DIAGNOSIS — H4311 Vitreous hemorrhage, right eye: Secondary | ICD-10-CM | POA: Diagnosis not present

## 2017-12-11 DIAGNOSIS — H35371 Puckering of macula, right eye: Secondary | ICD-10-CM | POA: Diagnosis not present

## 2017-12-17 DIAGNOSIS — H35371 Puckering of macula, right eye: Secondary | ICD-10-CM | POA: Diagnosis not present

## 2017-12-17 DIAGNOSIS — Z09 Encounter for follow-up examination after completed treatment for conditions other than malignant neoplasm: Secondary | ICD-10-CM | POA: Diagnosis not present

## 2018-01-21 DIAGNOSIS — E109 Type 1 diabetes mellitus without complications: Secondary | ICD-10-CM | POA: Diagnosis not present

## 2018-01-22 DIAGNOSIS — E109 Type 1 diabetes mellitus without complications: Secondary | ICD-10-CM | POA: Diagnosis not present

## 2018-02-03 DIAGNOSIS — E103591 Type 1 diabetes mellitus with proliferative diabetic retinopathy without macular edema, right eye: Secondary | ICD-10-CM | POA: Diagnosis not present

## 2018-02-03 DIAGNOSIS — H35371 Puckering of macula, right eye: Secondary | ICD-10-CM | POA: Diagnosis not present

## 2018-02-03 DIAGNOSIS — Z09 Encounter for follow-up examination after completed treatment for conditions other than malignant neoplasm: Secondary | ICD-10-CM | POA: Diagnosis not present

## 2018-02-11 DIAGNOSIS — E108 Type 1 diabetes mellitus with unspecified complications: Secondary | ICD-10-CM | POA: Diagnosis not present

## 2018-02-19 DIAGNOSIS — Z961 Presence of intraocular lens: Secondary | ICD-10-CM | POA: Diagnosis not present

## 2018-02-19 DIAGNOSIS — H44522 Atrophy of globe, left eye: Secondary | ICD-10-CM | POA: Diagnosis not present

## 2018-02-19 DIAGNOSIS — H26491 Other secondary cataract, right eye: Secondary | ICD-10-CM | POA: Diagnosis not present

## 2018-02-19 DIAGNOSIS — I10 Essential (primary) hypertension: Secondary | ICD-10-CM | POA: Diagnosis not present

## 2018-03-12 DIAGNOSIS — Z6829 Body mass index (BMI) 29.0-29.9, adult: Secondary | ICD-10-CM | POA: Diagnosis not present

## 2018-03-12 DIAGNOSIS — E1039 Type 1 diabetes mellitus with other diabetic ophthalmic complication: Secondary | ICD-10-CM | POA: Diagnosis not present

## 2018-03-12 DIAGNOSIS — I480 Paroxysmal atrial fibrillation: Secondary | ICD-10-CM | POA: Diagnosis not present

## 2018-03-12 DIAGNOSIS — N400 Enlarged prostate without lower urinary tract symptoms: Secondary | ICD-10-CM | POA: Diagnosis not present

## 2018-03-12 DIAGNOSIS — E1142 Type 2 diabetes mellitus with diabetic polyneuropathy: Secondary | ICD-10-CM | POA: Diagnosis not present

## 2018-03-12 DIAGNOSIS — I10 Essential (primary) hypertension: Secondary | ICD-10-CM | POA: Diagnosis not present

## 2018-03-12 DIAGNOSIS — H3520 Other non-diabetic proliferative retinopathy, unspecified eye: Secondary | ICD-10-CM | POA: Diagnosis not present

## 2018-03-12 DIAGNOSIS — M2550 Pain in unspecified joint: Secondary | ICD-10-CM | POA: Diagnosis not present

## 2018-03-12 DIAGNOSIS — Z1389 Encounter for screening for other disorder: Secondary | ICD-10-CM | POA: Diagnosis not present

## 2018-03-12 DIAGNOSIS — E7849 Other hyperlipidemia: Secondary | ICD-10-CM | POA: Diagnosis not present

## 2018-04-07 DIAGNOSIS — L570 Actinic keratosis: Secondary | ICD-10-CM | POA: Diagnosis not present

## 2018-04-07 DIAGNOSIS — D225 Melanocytic nevi of trunk: Secondary | ICD-10-CM | POA: Diagnosis not present

## 2018-04-07 DIAGNOSIS — D1801 Hemangioma of skin and subcutaneous tissue: Secondary | ICD-10-CM | POA: Diagnosis not present

## 2018-04-19 DIAGNOSIS — E109 Type 1 diabetes mellitus without complications: Secondary | ICD-10-CM | POA: Diagnosis not present

## 2018-05-08 ENCOUNTER — Other Ambulatory Visit: Payer: Self-pay | Admitting: Cardiovascular Disease

## 2018-05-10 ENCOUNTER — Telehealth: Payer: Self-pay | Admitting: Cardiovascular Disease

## 2018-05-10 MED ORDER — METOPROLOL TARTRATE 25 MG PO TABS: 25.0000 mg | ORAL_TABLET | Freq: Two times a day (BID) | ORAL | 6 refills | Status: DC

## 2018-05-10 NOTE — Telephone Encounter (Signed)
°*  STAT* If patient is at the pharmacy, call can be transferred to refill team.   1. Which medications need to be refilled? (please list name of each medication and dose if known) metoprolol .Wife don't know mg   2. Which pharmacy/location (including street and city if local pharmacy) is medication to be sent to?[facility]   3. Do they need a 30 day or 90 day supply? 90

## 2018-05-10 NOTE — Addendum Note (Signed)
Addended by: Alyson InglesBROOME, Irma Roulhac L on: 05/10/2018 10:16 AM   Modules accepted: Orders

## 2018-05-14 DIAGNOSIS — E108 Type 1 diabetes mellitus with unspecified complications: Secondary | ICD-10-CM | POA: Diagnosis not present

## 2018-07-13 DIAGNOSIS — I10 Essential (primary) hypertension: Secondary | ICD-10-CM | POA: Diagnosis not present

## 2018-07-13 DIAGNOSIS — H3520 Other non-diabetic proliferative retinopathy, unspecified eye: Secondary | ICD-10-CM | POA: Diagnosis not present

## 2018-07-13 DIAGNOSIS — I480 Paroxysmal atrial fibrillation: Secondary | ICD-10-CM | POA: Diagnosis not present

## 2018-07-13 DIAGNOSIS — E1039 Type 1 diabetes mellitus with other diabetic ophthalmic complication: Secondary | ICD-10-CM | POA: Diagnosis not present

## 2018-08-16 DIAGNOSIS — E1039 Type 1 diabetes mellitus with other diabetic ophthalmic complication: Secondary | ICD-10-CM | POA: Diagnosis not present

## 2018-08-16 DIAGNOSIS — H35371 Puckering of macula, right eye: Secondary | ICD-10-CM | POA: Diagnosis not present

## 2018-08-16 DIAGNOSIS — H44522 Atrophy of globe, left eye: Secondary | ICD-10-CM | POA: Diagnosis not present

## 2018-08-16 DIAGNOSIS — E103551 Type 1 diabetes mellitus with stable proliferative diabetic retinopathy, right eye: Secondary | ICD-10-CM | POA: Diagnosis not present

## 2018-08-24 DIAGNOSIS — H02402 Unspecified ptosis of left eyelid: Secondary | ICD-10-CM | POA: Diagnosis not present

## 2018-08-24 DIAGNOSIS — H18411 Arcus senilis, right eye: Secondary | ICD-10-CM | POA: Diagnosis not present

## 2018-08-24 DIAGNOSIS — Z961 Presence of intraocular lens: Secondary | ICD-10-CM | POA: Diagnosis not present

## 2018-08-24 DIAGNOSIS — E113291 Type 2 diabetes mellitus with mild nonproliferative diabetic retinopathy without macular edema, right eye: Secondary | ICD-10-CM | POA: Diagnosis not present

## 2018-08-30 DIAGNOSIS — E109 Type 1 diabetes mellitus without complications: Secondary | ICD-10-CM | POA: Diagnosis not present

## 2018-09-13 DIAGNOSIS — E109 Type 1 diabetes mellitus without complications: Secondary | ICD-10-CM | POA: Diagnosis not present

## 2018-11-13 DIAGNOSIS — E108 Type 1 diabetes mellitus with unspecified complications: Secondary | ICD-10-CM | POA: Diagnosis not present

## 2018-11-27 ENCOUNTER — Other Ambulatory Visit: Payer: Self-pay | Admitting: Cardiovascular Disease

## 2018-11-29 NOTE — Telephone Encounter (Signed)
Rx has been sent to the pharmacy electronically. ° °

## 2018-11-30 DIAGNOSIS — K760 Fatty (change of) liver, not elsewhere classified: Secondary | ICD-10-CM | POA: Diagnosis not present

## 2018-11-30 DIAGNOSIS — Z794 Long term (current) use of insulin: Secondary | ICD-10-CM | POA: Diagnosis not present

## 2018-11-30 DIAGNOSIS — E1039 Type 1 diabetes mellitus with other diabetic ophthalmic complication: Secondary | ICD-10-CM | POA: Diagnosis not present

## 2018-11-30 DIAGNOSIS — I5189 Other ill-defined heart diseases: Secondary | ICD-10-CM | POA: Diagnosis not present

## 2018-12-05 IMAGING — DX DG CHEST 1V PORT
1 series · 1 of 1 positions shown · non-contrast
Comparison: CT 11/03/2017

CLINICAL DATA: Pneumonia

EXAM:
PORTABLE CHEST 1 VIEW

[chest ap]
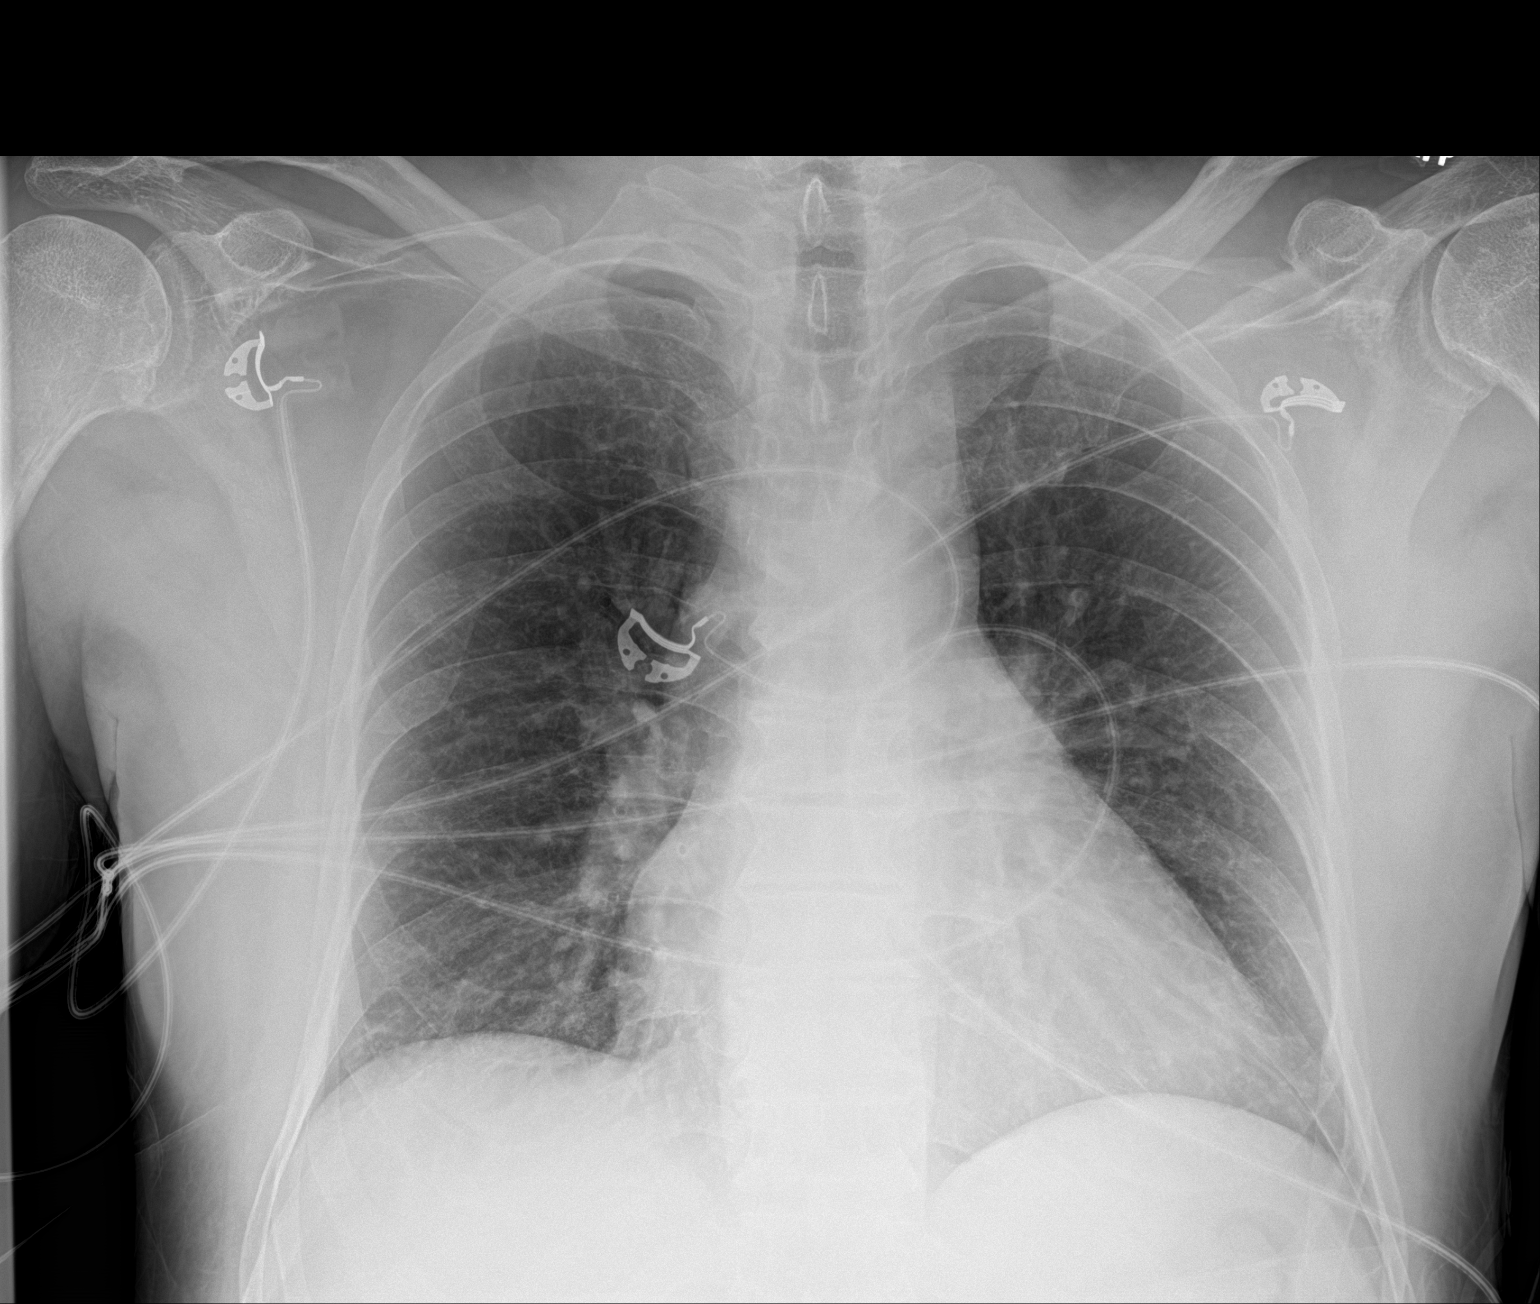

[1 of 1 positions shown; findings below may reference images not displayed]

FINDINGS: Mild cardiomegaly. Previously noted lower lung infiltrates on CT are
not well visualized radiographically. No pleural effusion. No
pneumothorax.
IMPRESSION: 1. Mild cardiomegaly
2. CT demonstrated lower lung infiltrates are not well seen on this
single view chest.

## 2018-12-05 IMAGING — CT CT ABD-PELV W/O CM
2 of 4 series · 15 of 46 positions shown, 17 images · non-contrast
Comparison: None.

CLINICAL DATA: 56-year-old male with nausea, vomiting and upper
abdominal pain. Elevated lipase.

EXAM:
CT ABDOMEN AND PELVIS WITHOUT CONTRAST
TECHNIQUE: Multidetector CT imaging of the abdomen and pelvis was performed
following the standard protocol without IV contrast.

[Series 3: ap without · axial · non-contrast · 0.69mm/px · z∈[+904,+1329]mm · 12 of 95 slices shown, 14 images]
[im 5/95  soft-tissue]
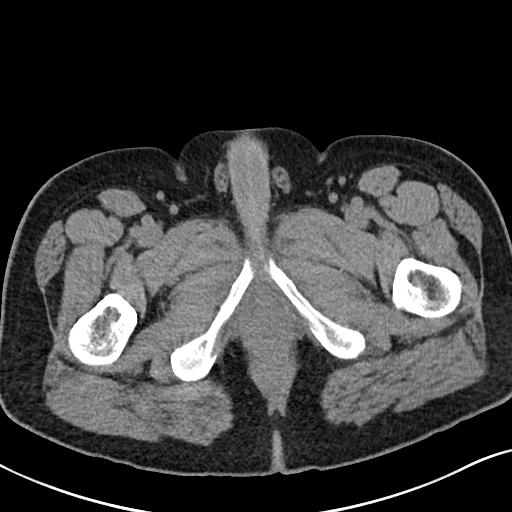
[im 5/95  bone]
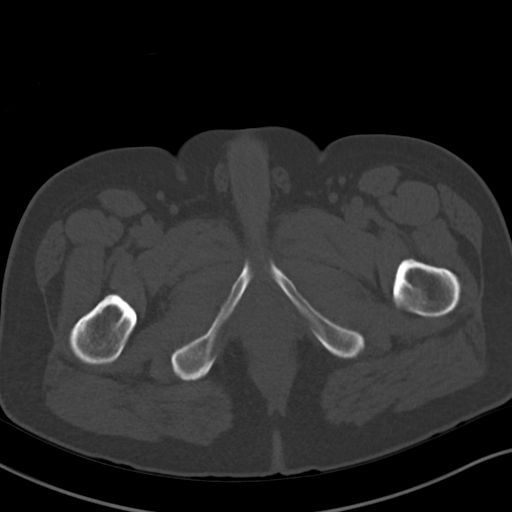
[im 15/95  soft-tissue]
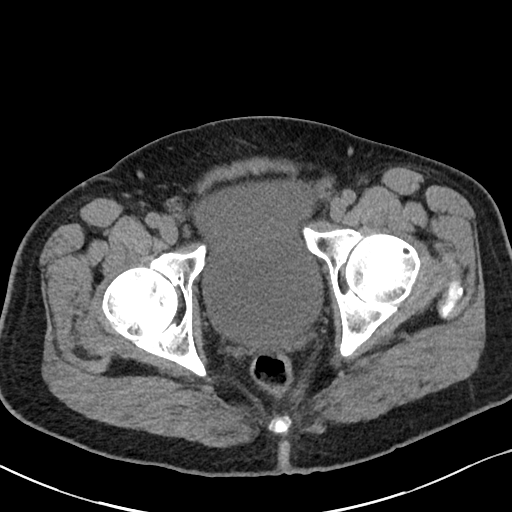
[im 20/95  soft-tissue]
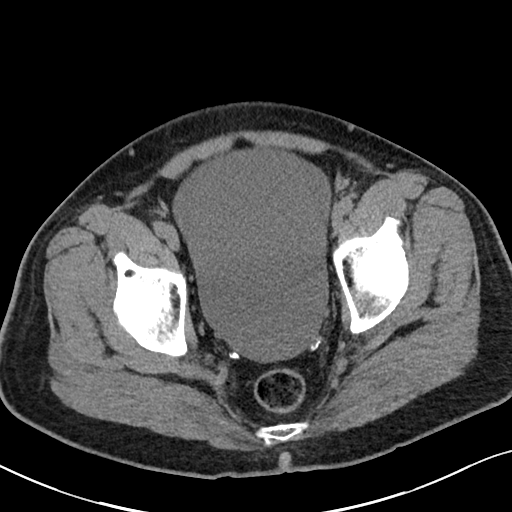
[im 30/95  soft-tissue]
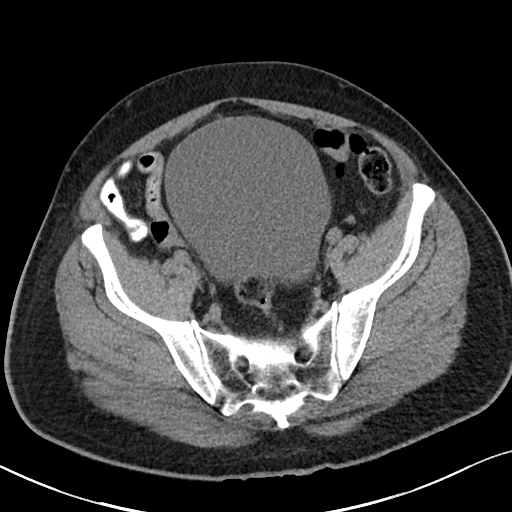
[im 35/95  soft-tissue]
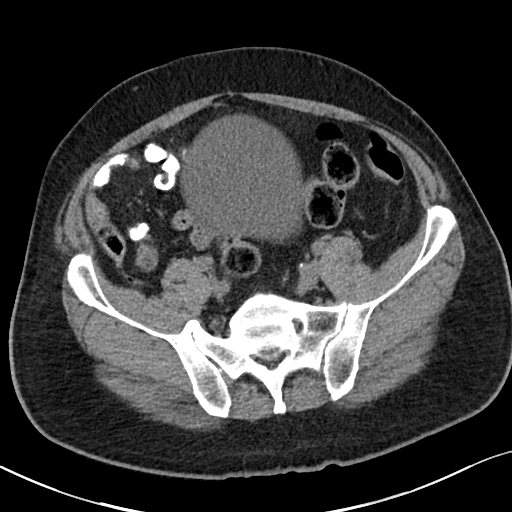
[im 45/95  soft-tissue]
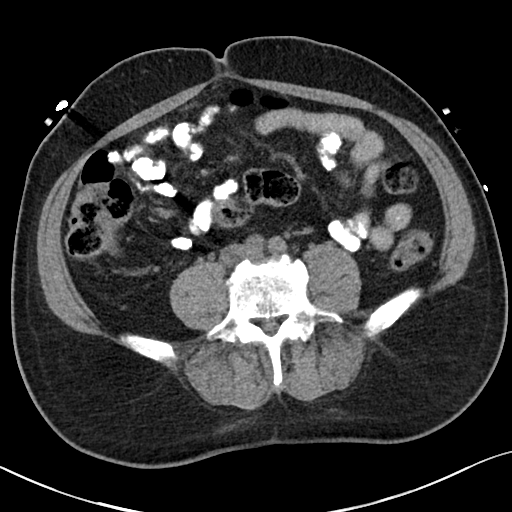
[im 50/95  soft-tissue]
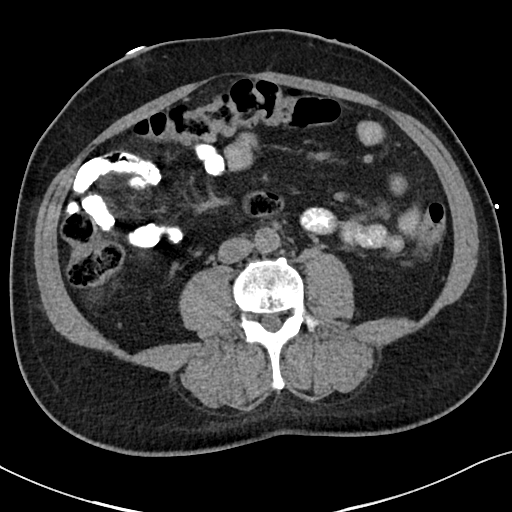
[im 60/95  soft-tissue]
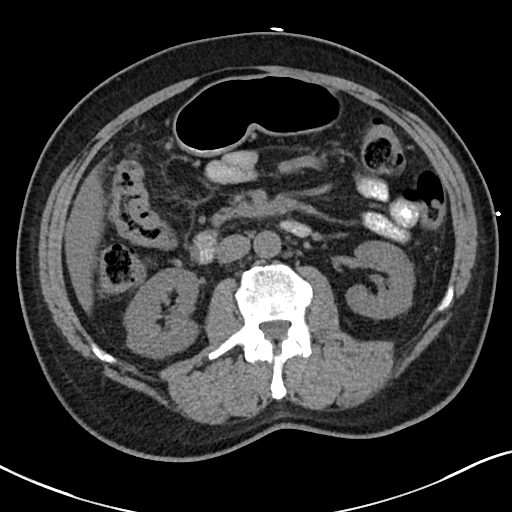
[im 65/95  soft-tissue]
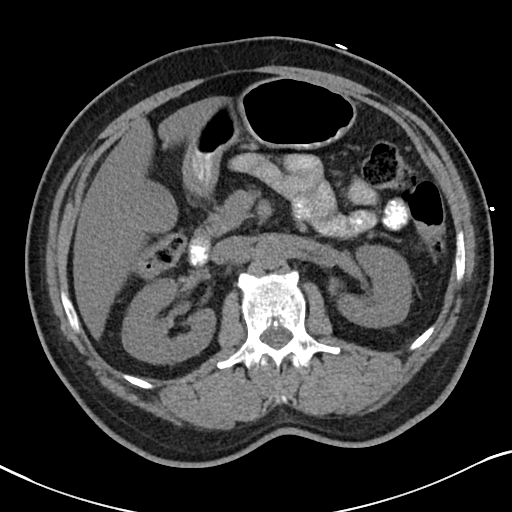
[im 65/95  bone]
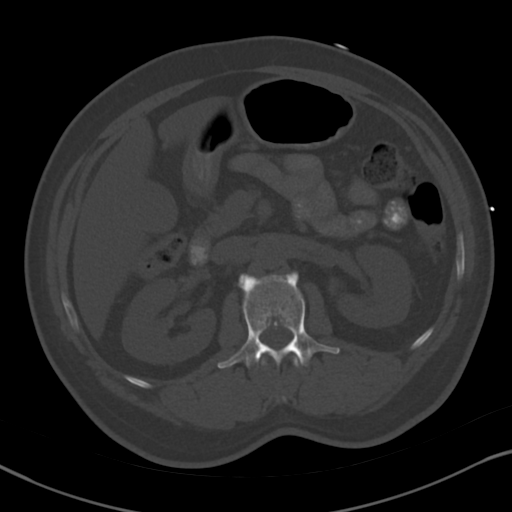
[im 75/95  soft-tissue]
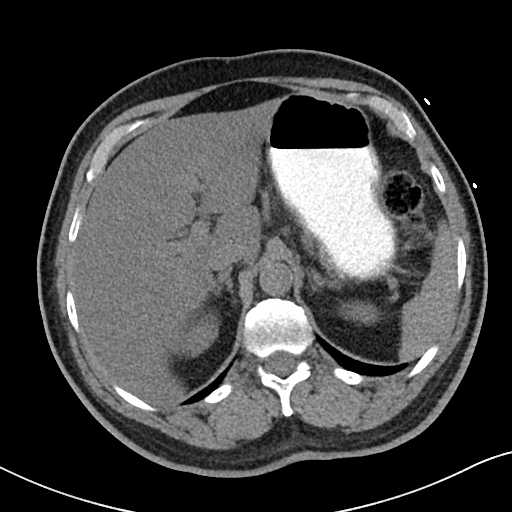
[im 80/95  soft-tissue]
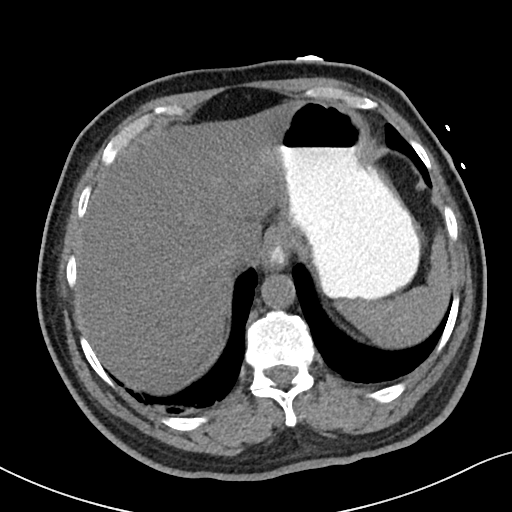
[im 90/95  soft-tissue]
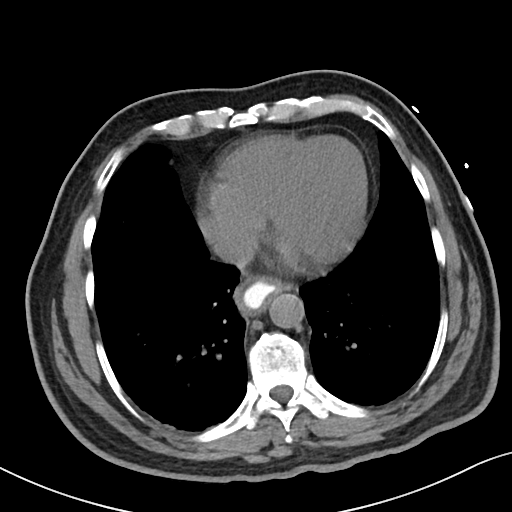

[Series 6: cor · coronal · 0.71mm/px · 3 of 89 slices shown]
[im 30/89  soft-tissue]
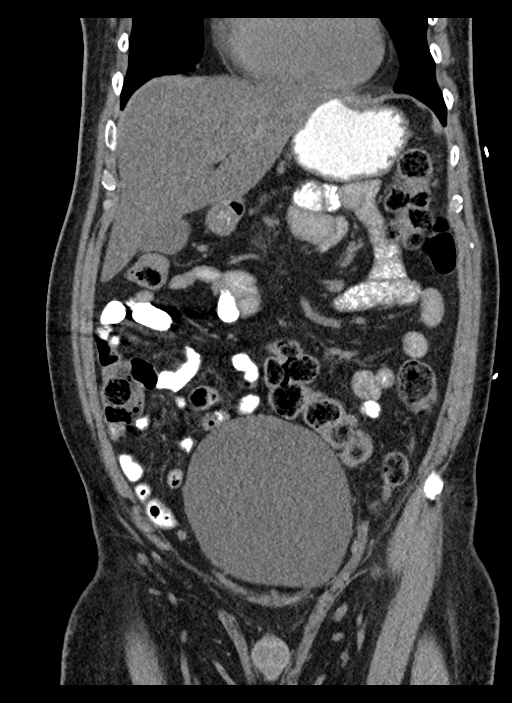
[im 40/89  soft-tissue]
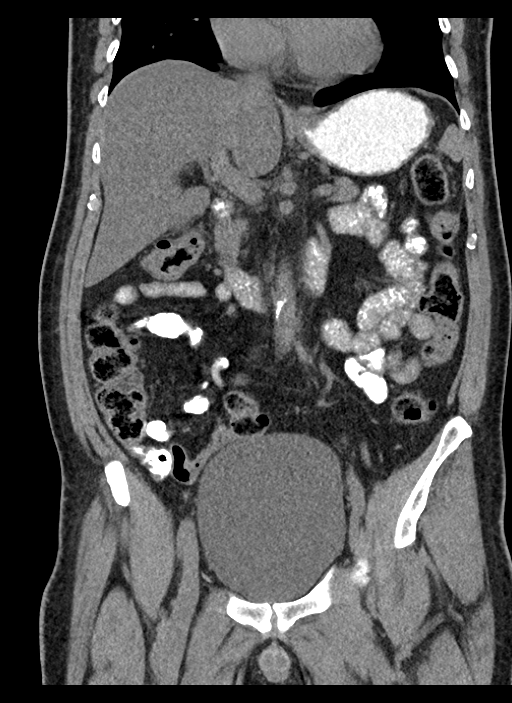
[im 49/89  soft-tissue]
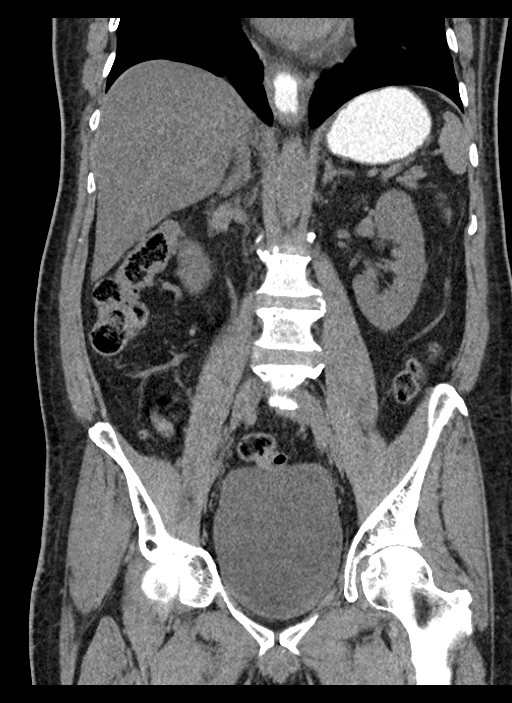

[15 of 46 positions shown; findings below may reference images not displayed]

FINDINGS: Lower chest: Patchy ground-glass attenuation airspace opacity and
macro nodularity in a peribronchovascular distribution in the left
lower and right middle lobes. No evidence of pleural effusion. Mild
circumferential esophageal wall thickening. Oral contrast material
is present within the visualized esophagus consistent with reflux.

Hepatobiliary: The hepatic parenchyma is diffusely low in
attenuation consistent with hepatic steatosis. High attenuation
material in the gallbladder lumen suggests sludge and/ or small
stones. No radiopaque calcified stones visualized. No intra or
extrahepatic biliary ductal dilatation.

Pancreas: Unremarkable. No pancreatic ductal dilatation or
surrounding inflammatory changes.

Spleen: Normal in size without focal abnormality.

Adrenals/Urinary Tract: Adrenal glands are unremarkable. Kidneys are
normal, without renal calculi, focal lesion, or hydronephrosis. The
bladder is distended with urine. At 18.6 x 10.1 x 11.7 cm (volume =
8864 cm^3)

Stomach/Bowel: No evidence of obstruction or focal bowel wall
thickening. Normal appendix in the right lower quadrant. The
terminal ileum is unremarkable.

Vascular/Lymphatic: Limited evaluation in the absence of intravenous
contrast. No aneurysm or significant atherosclerotic vascular
calcifications. No suspicious lymphadenopathy.

Reproductive: Prostate is unremarkable.

Other: No abdominal wall hernia or abnormality. No abdominopelvic
ascites.

Musculoskeletal: No acute fracture or aggressive appearing lytic or
blastic osseous lesion.
IMPRESSION: 1. Patchy ground-glass attenuation airspace opacities and macro
nodularity within the left lower and right middle lobes most
consistent with multifocal pneumonia.
2. The no definitive imaging findings for acute pancreatitis.
3. Circumferential esophageal wall thickening. Oral contrast
material is noted throughout the visualized thoracic esophagus.
Findings suggest reflux and likely esophagitis.
4. Bladder distention. There is an estimated a 1.15 L of volume
within the bladder.
5. High attenuation material within the gallbladder lumen suggests
the presence of sludge. No radiopaque stones identified.
6. Hepatic steatosis.

## 2018-12-06 DIAGNOSIS — E109 Type 1 diabetes mellitus without complications: Secondary | ICD-10-CM | POA: Diagnosis not present

## 2018-12-06 IMAGING — CR DG CHEST 2V
2 series · 2 of 2 positions shown · non-contrast
Comparison: Radiograph November 03, 2017.

CLINICAL DATA: Lung crackles.

EXAM:
CHEST  2 VIEW

[chest pa]
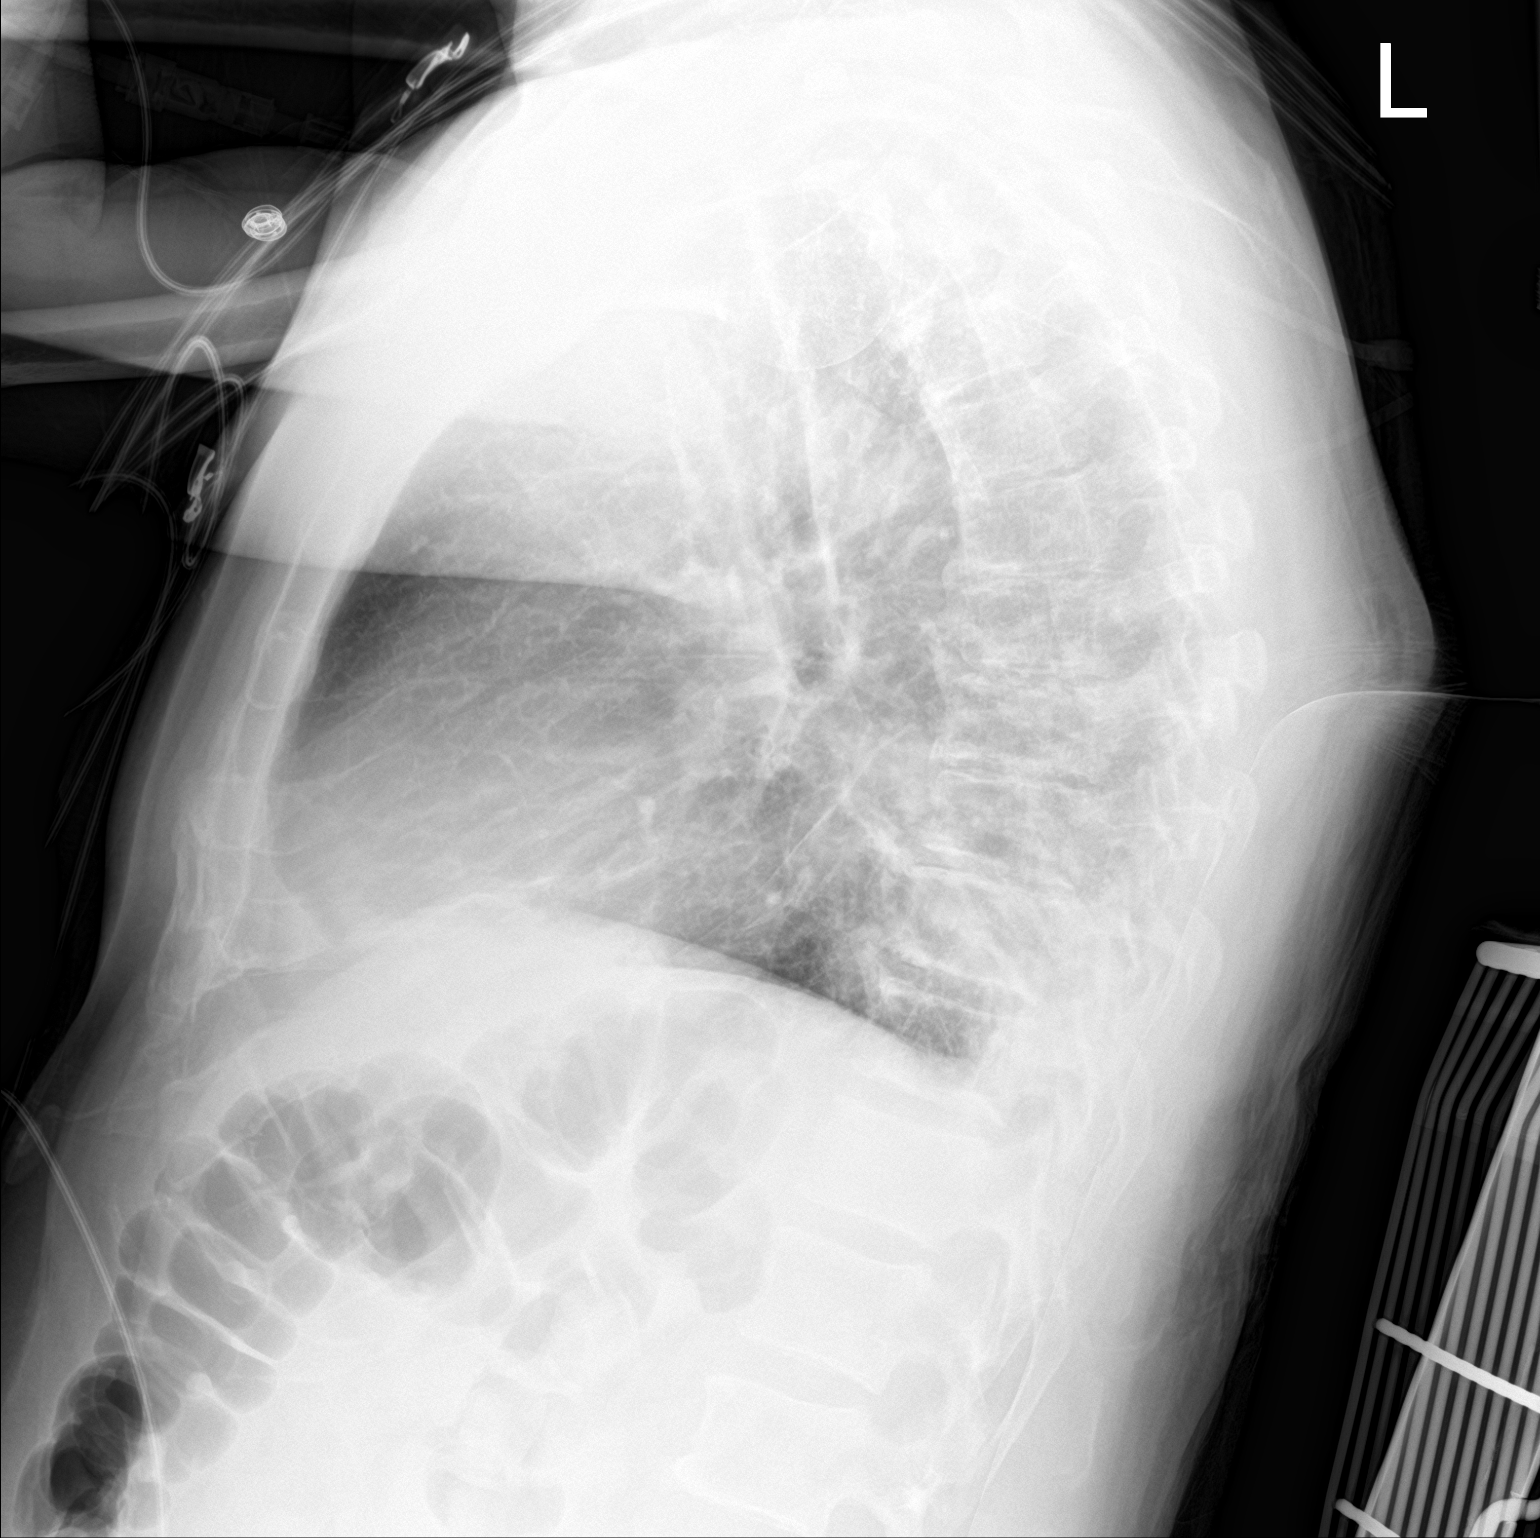

[chest ap]
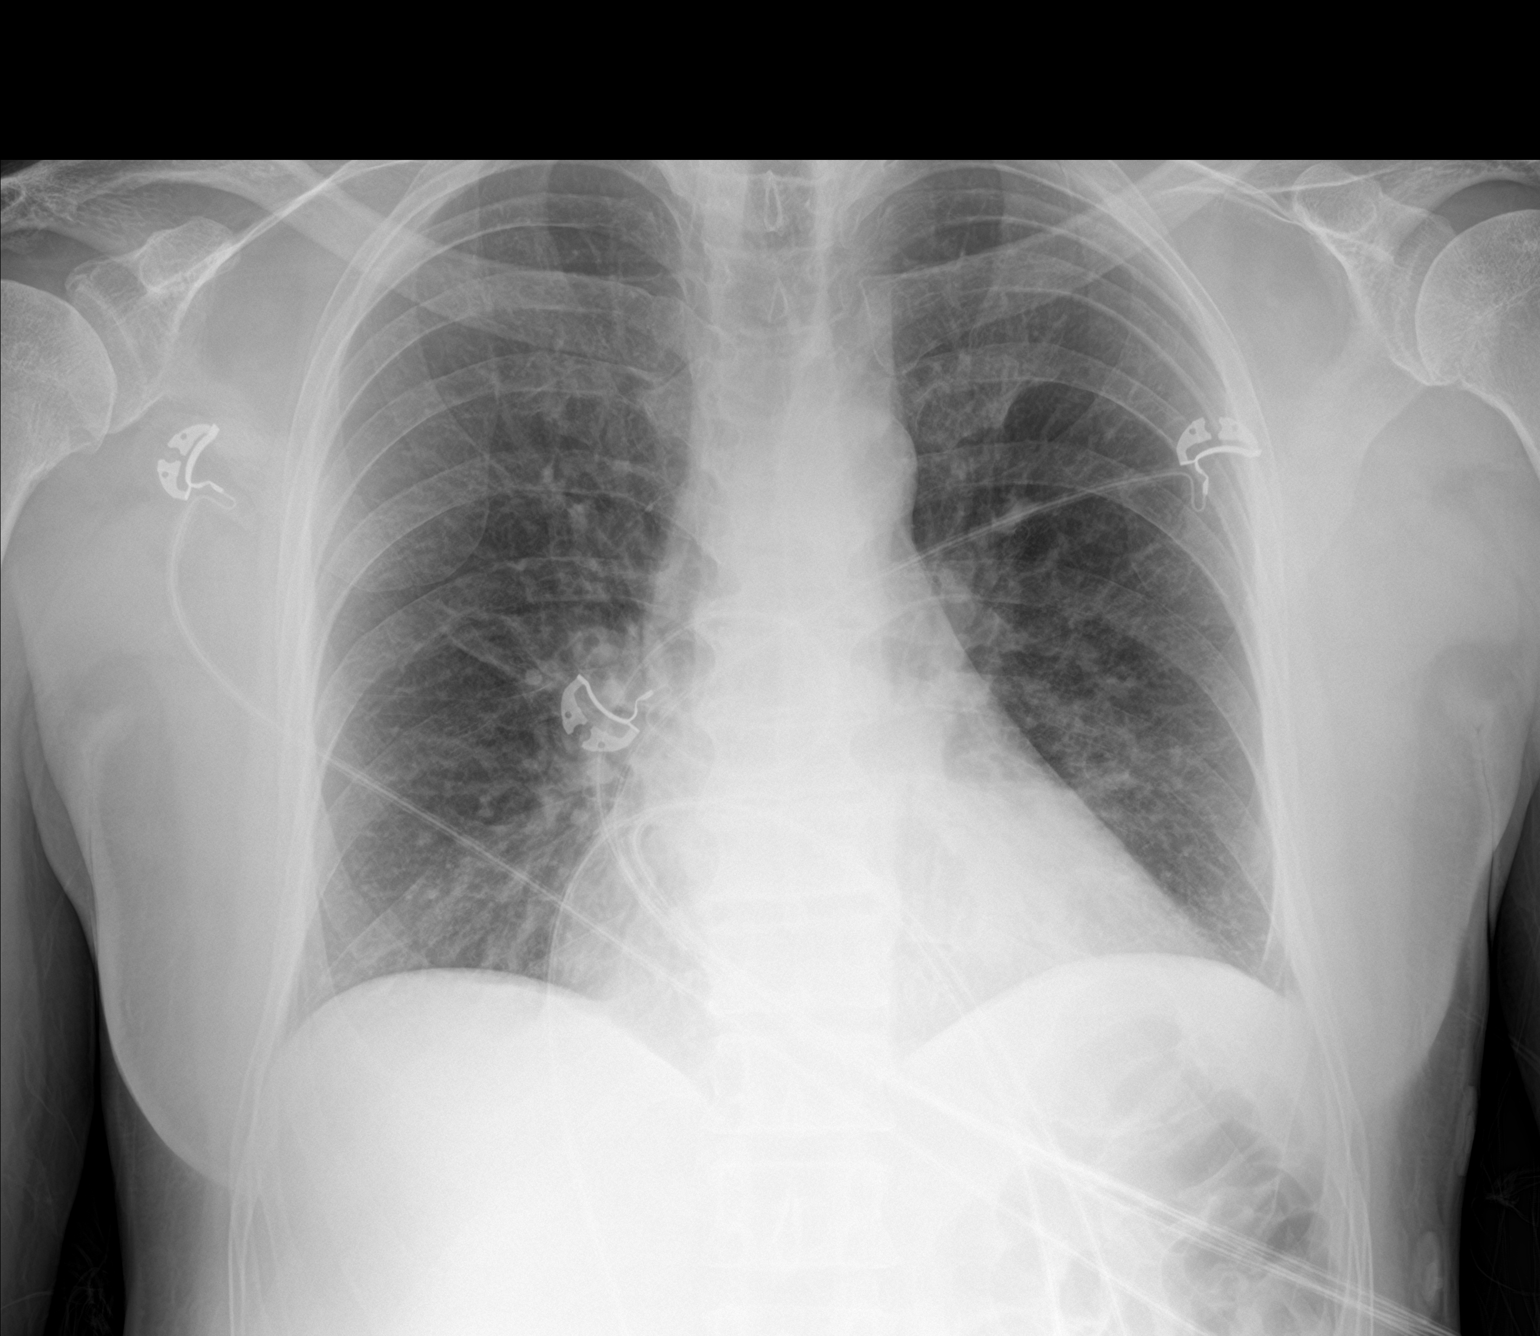

[2 of 2 positions shown; findings below may reference images not displayed]

FINDINGS: Stable cardiomediastinal silhouette. No pneumothorax is noted. Mild
left lower lobe opacity is noted concerning for possible pneumonia.
No significant pleural effusion is noted. Bony thorax is
unremarkable.
IMPRESSION: Mild left lower lobe opacity is noted concerning for possible
pneumonia.

## 2018-12-08 DIAGNOSIS — E109 Type 1 diabetes mellitus without complications: Secondary | ICD-10-CM | POA: Diagnosis not present

## 2018-12-29 ENCOUNTER — Encounter: Payer: Self-pay | Admitting: Cardiovascular Disease

## 2018-12-29 ENCOUNTER — Ambulatory Visit: Payer: Medicare Other | Admitting: Cardiovascular Disease

## 2018-12-29 VITALS — BP 132/58 | HR 86 | Ht 68.0 in | Wt 208.0 lb

## 2018-12-29 DIAGNOSIS — I10 Essential (primary) hypertension: Secondary | ICD-10-CM | POA: Diagnosis not present

## 2018-12-29 DIAGNOSIS — E782 Mixed hyperlipidemia: Secondary | ICD-10-CM | POA: Diagnosis not present

## 2018-12-29 NOTE — Assessment & Plan Note (Signed)
History of hyperlipidemia on statin therapy with lipid profile performed 03/12/2018 revealing total cholesterol 163, LDL 102 and HDL of 47.

## 2018-12-29 NOTE — Progress Notes (Signed)
12/29/2018 Scott Mosley   [birth year]  [id number]  Primary Physician Adrian Prince, MD Primary Cardiologist: Runell Gess MD Milagros Loll, [place], [place]  HPI:  Scott Mosley is a 58 y.o.  married Caucasian male father of 2, grandfather and one grandchild who I last saw in the office 11/20/2017. He was recently hospitalized 11/03/17 for 4 days with DKA and mild troponin leak along with brief episode of PAF with RVR. So the problems include type 1 diabetes since age 33 on an insulin pump and hyperlipidemia on statin therapy followed by his PCP. Apparently he had an insulin pump malfunction which may have contributed to his hospitalization. He was discharged home on Cardizem and metoprolol and his ACE inhibitor was discontinued. Since discharge she is starting to regain his strength. He denies chest pain or shortness of breath. Never had a heart attack or stroke. His father did have a myocardial infarction. A 2-D echo performed during his hospitalization was essentially normal. Since I saw him a year ago he is remained stable.  He denies chest pain or shortness of breath.  Current Meds  Medication Sig  . aspirin EC 81 MG tablet Take 81 mg by mouth daily.  . canagliflozin (INVOKANA) 100 MG TABS tablet Take by mouth daily before breakfast.  . chlorpheniramine-HYDROcodone (TUSSIONEX PENNKINETIC ER) 10-8 MG/5ML SUER Take 5 mLs by mouth every 12 (twelve) hours as needed for cough.  . insulin lispro (HUMALOG) 100 UNIT/ML injection Inject into the skin 3 (three) times daily before meals. Cont- insulin pump  . levofloxacin (LEVAQUIN) 750 MG tablet Take 1 tablet (750 mg total) by mouth daily.  . metoprolol tartrate (LOPRESSOR) 25 MG tablet TAKE 1 TABLET(25 MG) BY MOUTH TWICE DAILY  . pravastatin (PRAVACHOL) 80 MG tablet Take 80 mg by mouth daily.  . ramipril (ALTACE) 10 MG capsule Take 10 mg by mouth daily.     No Known Allergies  Social History   Socioeconomic History  . Marital status: Married      Spouse name: Not on file  . Number of children: Not on file  . Years of education: Not on file  . Highest education level: Not on file  Occupational History  . Not on file  Social Needs  . Financial resource strain: Not on file  . Food insecurity:    Worry: Not on file    Inability: Not on file  . Transportation needs:    Medical: Not on file    Non-medical: Not on file  Tobacco Use  . Smoking status: Never Smoker  . Smokeless tobacco: Never Used  Substance and Sexual Activity  . Alcohol use: No    Alcohol/week: 0.0 standard drinks  . Drug use: No  . Sexual activity: Never  Lifestyle  . Physical activity:    Days per week: Not on file    Minutes per session: Not on file  . Stress: Not on file  Relationships  . Social connections:    Talks on phone: Not on file    Gets together: Not on file    Attends religious service: Not on file    Active member of club or organization: Not on file    Attends meetings of clubs or organizations: Not on file    Relationship status: Not on file  . Intimate partner violence:    Fear of current or ex partner: Not on file    Emotionally abused: Not on file    Physically abused: Not  on file    Forced sexual activity: Not on file  Other Topics Concern  . Not on file  Social History Narrative  . Not on file     Review of Systems: General: negative for chills, fever, night sweats or weight changes.  Cardiovascular: negative for chest pain, dyspnea on exertion, edema, orthopnea, palpitations, paroxysmal nocturnal dyspnea or shortness of breath Dermatological: negative for rash Respiratory: negative for cough or wheezing Urologic: negative for hematuria Abdominal: negative for nausea, vomiting, diarrhea, bright red blood per rectum, melena, or hematemesis Neurologic: negative for visual changes, syncope, or dizziness All other systems reviewed and are otherwise negative except as noted above.    Blood pressure (!) 132/58, pulse 86,  height 5\' 8"  (1.727 m), weight 208 lb (94.3 kg).  General appearance: alert and no distress Neck: no adenopathy, no carotid bruit, no JVD, supple, symmetrical, trachea midline and thyroid not enlarged, symmetric, no tenderness/mass/nodules Lungs: clear to auscultation bilaterally Heart: regular rate and rhythm, S1, S2 normal, no murmur, click, rub or gallop Extremities: extremities normal, atraumatic, no cyanosis or edema Pulses: 2+ and symmetric Skin: Skin color, texture, turgor normal. No rashes or lesions Neurologic: Alert and oriented X 3, normal strength and tone. Normal symmetric reflexes. Normal coordination and gait  EKG sinus rhythm 86 with right bundle branch block.  I personally reviewed this EKG.  ASSESSMENT AND PLAN:   Hyperlipemia History of hyperlipidemia on statin therapy with lipid profile performed 03/12/2018 revealing total cholesterol 163, LDL 102 and HDL of 47.      Runell Gess MD FACP,FACC,FAHA, [facility] 12/29/2018 3:27 PM

## 2018-12-29 NOTE — Patient Instructions (Signed)
Medication Instructions:  Your physician recommends that you continue on your current medications as directed. Please refer to the Current Medication list given to you today.  If you need a refill on your cardiac medications before your next appointment, please call your pharmacy.   Lab work: none If you have labs (blood work) drawn today and your tests are completely normal, you will receive your results only by: Marland Kitchen MyChart Message (if you have MyChart) OR . A paper copy in the mail If you have any lab test that is abnormal or we need to change your treatment, we will call you to review the results.  Testing/Procedures: none  Follow-Up: At [facility], you and your health needs are our priority.  As part of our continuing mission to provide you with exceptional heart care, we have created designated Provider Care Teams.  These Care Teams include your primary Cardiologist (physician) and Advanced Practice Providers (APPs -  Physician Assistants and Nurse Practitioners) who all work together to provide you with the care you need, when you need it. . You will may schedule a follow up appointment as needed. You may see Dr. Allyson Sabal or one of the following Advanced Practice Providers on your designated Care Team:   . Corine Shelter, [place] . Azalee Course, PA-C . Micah Flesher, PA-C . Joni Reining, DNP . Theodore Demark, PA-C . Judy Pimple, PA-C . Marjie Skiff, PA-C

## 2019-02-11 DIAGNOSIS — E108 Type 1 diabetes mellitus with unspecified complications: Secondary | ICD-10-CM | POA: Diagnosis not present

## 2019-02-25 ENCOUNTER — Other Ambulatory Visit: Payer: Self-pay | Admitting: Cardiovascular Disease

## 2019-02-25 NOTE — Telephone Encounter (Signed)
Lopressor 25 mg refilled 

## 2019-03-04 DIAGNOSIS — E109 Type 1 diabetes mellitus without complications: Secondary | ICD-10-CM | POA: Diagnosis not present

## 2019-03-21 DIAGNOSIS — E109 Type 1 diabetes mellitus without complications: Secondary | ICD-10-CM | POA: Diagnosis not present

## 2019-03-30 DIAGNOSIS — E1039 Type 1 diabetes mellitus with other diabetic ophthalmic complication: Secondary | ICD-10-CM | POA: Diagnosis not present

## 2019-03-30 DIAGNOSIS — K760 Fatty (change of) liver, not elsewhere classified: Secondary | ICD-10-CM | POA: Diagnosis not present

## 2019-03-30 DIAGNOSIS — I5189 Other ill-defined heart diseases: Secondary | ICD-10-CM | POA: Diagnosis not present

## 2019-03-30 DIAGNOSIS — R7989 Other specified abnormal findings of blood chemistry: Secondary | ICD-10-CM | POA: Diagnosis not present

## 2019-04-07 DIAGNOSIS — E1039 Type 1 diabetes mellitus with other diabetic ophthalmic complication: Secondary | ICD-10-CM | POA: Diagnosis not present

## 2019-04-07 DIAGNOSIS — E7849 Other hyperlipidemia: Secondary | ICD-10-CM | POA: Diagnosis not present

## 2019-04-07 DIAGNOSIS — I10 Essential (primary) hypertension: Secondary | ICD-10-CM | POA: Diagnosis not present

## 2019-04-07 DIAGNOSIS — Z125 Encounter for screening for malignant neoplasm of prostate: Secondary | ICD-10-CM | POA: Diagnosis not present

## 2019-04-27 DIAGNOSIS — H35371 Puckering of macula, right eye: Secondary | ICD-10-CM | POA: Diagnosis not present

## 2019-04-27 DIAGNOSIS — E1039 Type 1 diabetes mellitus with other diabetic ophthalmic complication: Secondary | ICD-10-CM | POA: Diagnosis not present

## 2019-04-27 DIAGNOSIS — H44522 Atrophy of globe, left eye: Secondary | ICD-10-CM | POA: Diagnosis not present

## 2019-04-27 DIAGNOSIS — E103551 Type 1 diabetes mellitus with stable proliferative diabetic retinopathy, right eye: Secondary | ICD-10-CM | POA: Diagnosis not present

## 2019-05-23 ENCOUNTER — Other Ambulatory Visit: Payer: Self-pay | Admitting: Cardiovascular Disease

## 2019-05-26 DIAGNOSIS — E108 Type 1 diabetes mellitus with unspecified complications: Secondary | ICD-10-CM | POA: Diagnosis not present

## 2019-06-02 DIAGNOSIS — E109 Type 1 diabetes mellitus without complications: Secondary | ICD-10-CM | POA: Diagnosis not present

## 2019-08-03 DIAGNOSIS — I5189 Other ill-defined heart diseases: Secondary | ICD-10-CM | POA: Diagnosis not present

## 2019-08-03 DIAGNOSIS — E1039 Type 1 diabetes mellitus with other diabetic ophthalmic complication: Secondary | ICD-10-CM | POA: Diagnosis not present

## 2019-08-03 DIAGNOSIS — Z794 Long term (current) use of insulin: Secondary | ICD-10-CM | POA: Diagnosis not present

## 2019-08-03 DIAGNOSIS — H3520 Other non-diabetic proliferative retinopathy, unspecified eye: Secondary | ICD-10-CM | POA: Diagnosis not present

## 2019-08-12 DIAGNOSIS — Z23 Encounter for immunization: Secondary | ICD-10-CM | POA: Diagnosis not present

## 2019-08-30 DIAGNOSIS — H18411 Arcus senilis, right eye: Secondary | ICD-10-CM | POA: Diagnosis not present

## 2019-08-30 DIAGNOSIS — Z961 Presence of intraocular lens: Secondary | ICD-10-CM | POA: Diagnosis not present

## 2019-08-30 DIAGNOSIS — E113291 Type 2 diabetes mellitus with mild nonproliferative diabetic retinopathy without macular edema, right eye: Secondary | ICD-10-CM | POA: Diagnosis not present

## 2019-08-30 DIAGNOSIS — H02402 Unspecified ptosis of left eyelid: Secondary | ICD-10-CM | POA: Diagnosis not present

## 2019-09-01 DIAGNOSIS — E109 Type 1 diabetes mellitus without complications: Secondary | ICD-10-CM | POA: Diagnosis not present

## 2019-09-13 DIAGNOSIS — E108 Type 1 diabetes mellitus with unspecified complications: Secondary | ICD-10-CM | POA: Diagnosis not present

## 2019-11-29 DIAGNOSIS — K760 Fatty (change of) liver, not elsewhere classified: Secondary | ICD-10-CM | POA: Diagnosis not present

## 2019-11-29 DIAGNOSIS — E1039 Type 1 diabetes mellitus with other diabetic ophthalmic complication: Secondary | ICD-10-CM | POA: Diagnosis not present

## 2019-11-29 DIAGNOSIS — E103591 Type 1 diabetes mellitus with proliferative diabetic retinopathy without macular edema, right eye: Secondary | ICD-10-CM | POA: Diagnosis not present

## 2019-11-29 DIAGNOSIS — E669 Obesity, unspecified: Secondary | ICD-10-CM | POA: Diagnosis not present

## 2019-12-07 DIAGNOSIS — E109 Type 1 diabetes mellitus without complications: Secondary | ICD-10-CM | POA: Diagnosis not present

## 2019-12-15 DIAGNOSIS — E7849 Other hyperlipidemia: Secondary | ICD-10-CM | POA: Diagnosis not present

## 2019-12-15 DIAGNOSIS — Z794 Long term (current) use of insulin: Secondary | ICD-10-CM | POA: Diagnosis not present

## 2019-12-15 DIAGNOSIS — I10 Essential (primary) hypertension: Secondary | ICD-10-CM | POA: Diagnosis not present

## 2019-12-15 DIAGNOSIS — Z4681 Encounter for fitting and adjustment of insulin pump: Secondary | ICD-10-CM | POA: Diagnosis not present

## 2019-12-15 DIAGNOSIS — I480 Paroxysmal atrial fibrillation: Secondary | ICD-10-CM | POA: Diagnosis not present

## 2019-12-15 DIAGNOSIS — E103591 Type 1 diabetes mellitus with proliferative diabetic retinopathy without macular edema, right eye: Secondary | ICD-10-CM | POA: Diagnosis not present

## 2019-12-30 DIAGNOSIS — E109 Type 1 diabetes mellitus without complications: Secondary | ICD-10-CM | POA: Diagnosis not present

## 2020-01-26 DIAGNOSIS — H54413A Blindness right eye category 3, normal vision left eye: Secondary | ICD-10-CM | POA: Diagnosis not present

## 2020-01-26 DIAGNOSIS — E103551 Type 1 diabetes mellitus with stable proliferative diabetic retinopathy, right eye: Secondary | ICD-10-CM | POA: Diagnosis not present

## 2020-01-26 DIAGNOSIS — E1039 Type 1 diabetes mellitus with other diabetic ophthalmic complication: Secondary | ICD-10-CM | POA: Diagnosis not present

## 2020-01-26 DIAGNOSIS — H44522 Atrophy of globe, left eye: Secondary | ICD-10-CM | POA: Diagnosis not present

## 2020-02-17 DIAGNOSIS — E108 Type 1 diabetes mellitus with unspecified complications: Secondary | ICD-10-CM | POA: Diagnosis not present

## 2020-03-01 ENCOUNTER — Other Ambulatory Visit: Payer: Self-pay

## 2020-03-01 MED ORDER — METOPROLOL TARTRATE 25 MG PO TABS: ORAL_TABLET | ORAL | 2 refills | Status: DC

## 2020-03-06 DIAGNOSIS — Z0120 Encounter for dental examination and cleaning without abnormal findings: Secondary | ICD-10-CM | POA: Diagnosis not present

## 2020-04-05 DIAGNOSIS — I5189 Other ill-defined heart diseases: Secondary | ICD-10-CM | POA: Diagnosis not present

## 2020-04-05 DIAGNOSIS — K760 Fatty (change of) liver, not elsewhere classified: Secondary | ICD-10-CM | POA: Diagnosis not present

## 2020-04-05 DIAGNOSIS — E7849 Other hyperlipidemia: Secondary | ICD-10-CM | POA: Diagnosis not present

## 2020-04-05 DIAGNOSIS — E103591 Type 1 diabetes mellitus with proliferative diabetic retinopathy without macular edema, right eye: Secondary | ICD-10-CM | POA: Diagnosis not present

## 2020-05-13 DIAGNOSIS — E108 Type 1 diabetes mellitus with unspecified complications: Secondary | ICD-10-CM | POA: Diagnosis not present

## 2020-05-23 DIAGNOSIS — E108 Type 1 diabetes mellitus with unspecified complications: Secondary | ICD-10-CM | POA: Diagnosis not present

## 2020-05-23 DIAGNOSIS — I10 Essential (primary) hypertension: Secondary | ICD-10-CM | POA: Diagnosis not present

## 2020-05-23 DIAGNOSIS — Z4681 Encounter for fitting and adjustment of insulin pump: Secondary | ICD-10-CM | POA: Diagnosis not present

## 2020-05-23 DIAGNOSIS — Z794 Long term (current) use of insulin: Secondary | ICD-10-CM | POA: Diagnosis not present

## 2020-08-03 DIAGNOSIS — E109 Type 1 diabetes mellitus without complications: Secondary | ICD-10-CM | POA: Diagnosis not present

## 2020-08-08 DIAGNOSIS — I10 Essential (primary) hypertension: Secondary | ICD-10-CM | POA: Diagnosis not present

## 2020-08-08 DIAGNOSIS — E785 Hyperlipidemia, unspecified: Secondary | ICD-10-CM | POA: Diagnosis not present

## 2020-08-08 DIAGNOSIS — E103591 Type 1 diabetes mellitus with proliferative diabetic retinopathy without macular edema, right eye: Secondary | ICD-10-CM | POA: Diagnosis not present

## 2020-08-08 DIAGNOSIS — H3520 Other non-diabetic proliferative retinopathy, unspecified eye: Secondary | ICD-10-CM | POA: Diagnosis not present

## 2020-08-11 DIAGNOSIS — E108 Type 1 diabetes mellitus with unspecified complications: Secondary | ICD-10-CM | POA: Diagnosis not present

## 2020-08-21 DIAGNOSIS — E1039 Type 1 diabetes mellitus with other diabetic ophthalmic complication: Secondary | ICD-10-CM | POA: Diagnosis not present

## 2020-08-21 DIAGNOSIS — I10 Essential (primary) hypertension: Secondary | ICD-10-CM | POA: Diagnosis not present

## 2020-08-21 DIAGNOSIS — Z4681 Encounter for fitting and adjustment of insulin pump: Secondary | ICD-10-CM | POA: Diagnosis not present

## 2020-08-21 DIAGNOSIS — Z794 Long term (current) use of insulin: Secondary | ICD-10-CM | POA: Diagnosis not present

## 2020-09-04 DIAGNOSIS — H18411 Arcus senilis, right eye: Secondary | ICD-10-CM | POA: Diagnosis not present

## 2020-09-04 DIAGNOSIS — Z961 Presence of intraocular lens: Secondary | ICD-10-CM | POA: Diagnosis not present

## 2020-09-04 DIAGNOSIS — H02402 Unspecified ptosis of left eyelid: Secondary | ICD-10-CM | POA: Diagnosis not present

## 2020-09-04 DIAGNOSIS — E113291 Type 2 diabetes mellitus with mild nonproliferative diabetic retinopathy without macular edema, right eye: Secondary | ICD-10-CM | POA: Diagnosis not present

## 2020-10-22 ENCOUNTER — Other Ambulatory Visit: Payer: Self-pay

## 2020-10-22 ENCOUNTER — Ambulatory Visit (INDEPENDENT_AMBULATORY_CARE_PROVIDER_SITE_OTHER): Payer: Medicare Other | Admitting: Ophthalmology

## 2020-10-22 ENCOUNTER — Encounter (INDEPENDENT_AMBULATORY_CARE_PROVIDER_SITE_OTHER): Payer: Self-pay | Admitting: Ophthalmology

## 2020-10-22 DIAGNOSIS — E103551 Type 1 diabetes mellitus with stable proliferative diabetic retinopathy, right eye: Secondary | ICD-10-CM | POA: Insufficient documentation

## 2020-10-22 DIAGNOSIS — H44522 Atrophy of globe, left eye: Secondary | ICD-10-CM | POA: Diagnosis not present

## 2020-10-22 DIAGNOSIS — Z9889 Other specified postprocedural states: Secondary | ICD-10-CM

## 2020-10-22 NOTE — Progress Notes (Signed)
10/22/2020     CHIEF COMPLAINT Patient presents for Retina Follow Up   HISTORY OF PRESENT ILLNESS: Scott Mosley is a 59 y.o. male who presents to the clinic today for:   HPI    Retina Follow Up    Patient presents with  Diabetic Retinopathy.  In right eye.  Severity is moderate.  Duration of 9 months.  Since onset it is stable.  I, the attending physician,  performed the HPI with the patient and updated documentation appropriately.          Comments    9 Month Diabetic Exam OD. FP  Pt states vision has been stable. Denies FOL and floaters. BGL: 112 A1C: 6.2       Last edited by Elyse Jarvis on 10/22/2020  9:45 AM. (History)      Referring physician: Adrian Prince, MD [address] [place],  [place] [address]  HISTORICAL INFORMATION:   Selected notes from the MEDICAL RECORD NUMBER    Lab Results  Component Value Date   HGBA1C 7.7 (H) 11/03/2017     CURRENT MEDICATIONS: No current outpatient medications on file. (Ophthalmic Drugs)   No current facility-administered medications for this visit. (Ophthalmic Drugs)   Current Outpatient Medications (Other)  Medication Sig  . aspirin EC 81 MG tablet Take 81 mg by mouth daily.  . canagliflozin (INVOKANA) 100 MG TABS tablet Take by mouth daily before breakfast.  . chlorpheniramine-HYDROcodone (TUSSIONEX PENNKINETIC ER) 10-8 MG/5ML SUER Take 5 mLs by mouth every 12 (twelve) hours as needed for cough.  . insulin lispro (HUMALOG) 100 UNIT/ML injection Inject into the skin 3 (three) times daily before meals. Cont- insulin pump  . levofloxacin (LEVAQUIN) 750 MG tablet Take 1 tablet (750 mg total) by mouth daily.  . metoprolol tartrate (LOPRESSOR) 25 MG tablet TAKE 1 TABLET(25 MG) BY MOUTH TWICE DAILY  . pravastatin (PRAVACHOL) 80 MG tablet Take 80 mg by mouth daily.  . ramipril (ALTACE) 10 MG capsule Take 10 mg by mouth daily.   No current facility-administered medications for this visit. (Other)      REVIEW OF  SYSTEMS: ROS    Positive for: Endocrine   Last edited by Elyse Jarvis on 10/22/2020  9:39 AM. (History)       ALLERGIES No Known Allergies  PAST MEDICAL HISTORY Past Medical History:  Diagnosis Date  . Diabetes mellitus without complication (HCC)    History reviewed. No pertinent surgical history.  FAMILY HISTORY History reviewed. No pertinent family history.  SOCIAL HISTORY Social History   Tobacco Use  . Smoking status: Never Smoker  . Smokeless tobacco: Never Used  Vaping Use  . Vaping Use: Never used  Substance Use Topics  . Alcohol use: No    Alcohol/week: 0.0 standard drinks  . Drug use: No         OPHTHALMIC EXAM:  Base Eye Exam    Visual Acuity (Snellen - Linear)      Right Left   Dist [place] 20/20 -2 NLP       Tonometry (Tonopen, 9:45 AM)      Right Left   Pressure 15 22       Pupils      Dark Light Shape React   Right 4 3 Round Brisk   Left           Visual Fields (Counting fingers)      Left Right   Restrictions Total superior temporal, inferior temporal, superior nasal, inferior nasal  deficiencies Partial outer superior temporal, superior nasal deficiencies       Neuro/Psych    Oriented x3: Yes   Mood/Affect: Normal       Dilation    Right eye: 1.0% Mydriacyl, 2.5% Phenylephrine @ 9:45 AM        Slit Lamp and Fundus Exam    External Exam      Right Left   External Normal Normal       Slit Lamp Exam      Right Left   Lids/Lashes Normal Normal   Conjunctiva/Sclera White and quiet White and quiet   Cornea Clear Clear   Anterior Chamber Deep and quiet Deep and quiet   Iris Round and reactive Round and reactive   Anterior Vitreous Normal Normal       Fundus Exam      Right Left   Posterior Vitreous Clear, vitrectomized    Disc Normal    C/D Ratio 0.4    Macula Microaneurysms, no clinically significant macular edema No views posteriorly   Vessels PDR-quiet    Periphery Good PRP, no active disease            IMAGING AND PROCEDURES  Imaging and Procedures for 10/22/20  Color Fundus Photography Optos - OU - Both Eyes       Right Eye Progression has been stable. Disc findings include normal observations. Macula : microaneurysms.   Left Eye Progression has no prior data.   Notes Quiescent proliferative diabetic retinopathy right eye  No active maculopathy  Status post vitrectomy membrane peel right eye  OS no views                ASSESSMENT/PLAN:  Controlled type 1 diabetes mellitus with stable proliferative retinopathy of right eye (HCC) The nature of regressed proliferative diabetic retinopathy was discussed with the patient. The patient was advised to maintain good glucose, blood pressure, monitor kidney function and serum lipid control as advised by personal physician. Rare risk for reactivation of progression exist with untreated severe anemia, untreated renal failure, untreated heart failure, and smoking. Complete avoidance of smoking was recommended. The chance of recurrent proliferative diabetic retinopathy was discussed as well as the chance of vitreous hemorrhage for which further treatments may be necessary.   Explained to the patient that the quiescent  proliferative diabetic retinopathy disease is unlikely to ever worsen.  Worsening factors would include however severe anemia, hypertension out-of-control or impending renal failure.      ICD-10-CM   1. Controlled type 1 diabetes mellitus with stable proliferative retinopathy of right eye (HCC)  E10.3551 Color Fundus Photography Optos - OU - Both Eyes  2. Atrophy of globe of left eye  H44.522   3. History of vitrectomy  Z98.890     1.  2.  3.  Ophthalmic Meds Ordered this visit:  No orders of the defined types were placed in this encounter.      Return in about 1 year (around 10/22/2021) for dilate, OD, OCT.  There are no Patient Instructions on file for this visit.   Explained the diagnoses, plan,  and follow up with the patient and they expressed understanding.  Patient expressed understanding of the importance of proper follow up care.   Alford Highland Olanda Boughner M.D. Diseases & Surgery of the Retina and Vitreous [facility] 10/22/20     Abbreviations: M myopia (nearsighted); A astigmatism; H hyperopia (farsighted); P presbyopia; Mrx spectacle prescription;  CTL contact lenses; OD right eye;  OS left eye; OU both eyes  XT exotropia; ET esotropia; PEK punctate epithelial keratitis; PEE punctate epithelial erosions; DES dry eye syndrome; MGD meibomian gland dysfunction; ATs artificial tears; PFAT's preservative free artificial tears; NSC nuclear sclerotic cataract; PSC posterior subcapsular cataract; ERM epi-retinal membrane; PVD posterior vitreous detachment; RD retinal detachment; DM diabetes mellitus; DR diabetic retinopathy; NPDR non-proliferative diabetic retinopathy; PDR proliferative diabetic retinopathy; CSME clinically significant macular edema; DME diabetic macular edema; dbh dot blot hemorrhages; CWS cotton wool spot; POAG primary open angle glaucoma; C/D cup-to-disc ratio; HVF humphrey visual field; GVF goldmann visual field; OCT optical coherence tomography; IOP intraocular pressure; BRVO Branch retinal vein occlusion; CRVO central retinal vein occlusion; CRAO central retinal artery occlusion; BRAO branch retinal artery occlusion; RT retinal tear; SB scleral buckle; PPV pars plana vitrectomy; VH Vitreous hemorrhage; PRP panretinal laser photocoagulation; IVK intravitreal kenalog; VMT vitreomacular traction; MH Macular hole;  NVD neovascularization of the disc; NVE neovascularization elsewhere; AREDS age related eye disease study; ARMD age related macular degeneration; POAG primary open angle glaucoma; EBMD epithelial/anterior basement membrane dystrophy; ACIOL anterior chamber intraocular lens; IOL intraocular lens; PCIOL posterior chamber intraocular lens; Phaco/IOL  phacoemulsification with intraocular lens placement; PRK photorefractive keratectomy; LASIK laser assisted in situ keratomileusis; HTN hypertension; DM diabetes mellitus; COPD chronic obstructive pulmonary disease

## 2020-10-22 NOTE — Assessment & Plan Note (Signed)

## 2020-10-29 ENCOUNTER — Encounter (INDEPENDENT_AMBULATORY_CARE_PROVIDER_SITE_OTHER): Payer: Medicare Other | Admitting: Ophthalmology

## 2020-11-06 DIAGNOSIS — E109 Type 1 diabetes mellitus without complications: Secondary | ICD-10-CM | POA: Diagnosis not present

## 2020-11-18 ENCOUNTER — Other Ambulatory Visit: Payer: Self-pay | Admitting: Cardiovascular Disease

## 2020-11-21 DIAGNOSIS — E1039 Type 1 diabetes mellitus with other diabetic ophthalmic complication: Secondary | ICD-10-CM | POA: Diagnosis not present

## 2020-11-21 DIAGNOSIS — Z4681 Encounter for fitting and adjustment of insulin pump: Secondary | ICD-10-CM | POA: Diagnosis not present

## 2020-11-21 DIAGNOSIS — I10 Essential (primary) hypertension: Secondary | ICD-10-CM | POA: Diagnosis not present

## 2020-11-21 DIAGNOSIS — I2510 Atherosclerotic heart disease of native coronary artery without angina pectoris: Secondary | ICD-10-CM | POA: Diagnosis not present

## 2020-11-26 ENCOUNTER — Telehealth: Payer: Self-pay | Admitting: Cardiovascular Disease

## 2020-11-26 ENCOUNTER — Other Ambulatory Visit: Payer: Self-pay | Admitting: Cardiovascular Disease

## 2020-11-26 MED ORDER — METOPROLOL TARTRATE 25 MG PO TABS: ORAL_TABLET | ORAL | 0 refills | Status: DC

## 2020-11-26 NOTE — Telephone Encounter (Signed)
Rx has been sent to the pharmacy electronically. ° °

## 2020-11-26 NOTE — Telephone Encounter (Signed)
°*  STAT* If patient is at the pharmacy, call can be transferred to refill team.   1. Which medications need to be refilled? (please list name of each medication and dose if known)  metoprolol tartrate (LOPRESSOR) 25 MG tablet  2. Which pharmacy/location (including street and city if local pharmacy) is medication to be sent to?  [id number] - [place], [place] - [address]  3. Do they need a 30 day or 90 day supply? 90 with refills

## 2020-12-04 DIAGNOSIS — E1039 Type 1 diabetes mellitus with other diabetic ophthalmic complication: Secondary | ICD-10-CM | POA: Diagnosis not present

## 2020-12-18 DIAGNOSIS — K760 Fatty (change of) liver, not elsewhere classified: Secondary | ICD-10-CM | POA: Diagnosis not present

## 2020-12-18 DIAGNOSIS — I2510 Atherosclerotic heart disease of native coronary artery without angina pectoris: Secondary | ICD-10-CM | POA: Diagnosis not present

## 2020-12-18 DIAGNOSIS — E103591 Type 1 diabetes mellitus with proliferative diabetic retinopathy without macular edema, right eye: Secondary | ICD-10-CM | POA: Diagnosis not present

## 2020-12-18 DIAGNOSIS — I10 Essential (primary) hypertension: Secondary | ICD-10-CM | POA: Diagnosis not present

## 2021-02-05 DIAGNOSIS — E109 Type 1 diabetes mellitus without complications: Secondary | ICD-10-CM | POA: Diagnosis not present

## 2021-02-19 DIAGNOSIS — I2510 Atherosclerotic heart disease of native coronary artery without angina pectoris: Secondary | ICD-10-CM | POA: Diagnosis not present

## 2021-02-19 DIAGNOSIS — E1039 Type 1 diabetes mellitus with other diabetic ophthalmic complication: Secondary | ICD-10-CM | POA: Diagnosis not present

## 2021-02-19 DIAGNOSIS — Z4681 Encounter for fitting and adjustment of insulin pump: Secondary | ICD-10-CM | POA: Diagnosis not present

## 2021-02-19 DIAGNOSIS — I10 Essential (primary) hypertension: Secondary | ICD-10-CM | POA: Diagnosis not present

## 2021-03-03 DIAGNOSIS — E1039 Type 1 diabetes mellitus with other diabetic ophthalmic complication: Secondary | ICD-10-CM | POA: Diagnosis not present

## 2021-03-22 DIAGNOSIS — S9032XA Contusion of left foot, initial encounter: Secondary | ICD-10-CM | POA: Diagnosis not present

## 2021-03-22 DIAGNOSIS — M79672 Pain in left foot: Secondary | ICD-10-CM | POA: Diagnosis not present

## 2021-04-15 DIAGNOSIS — E785 Hyperlipidemia, unspecified: Secondary | ICD-10-CM | POA: Diagnosis not present

## 2021-04-15 DIAGNOSIS — E1039 Type 1 diabetes mellitus with other diabetic ophthalmic complication: Secondary | ICD-10-CM | POA: Diagnosis not present

## 2021-04-15 DIAGNOSIS — Z0000 Encounter for general adult medical examination without abnormal findings: Secondary | ICD-10-CM | POA: Diagnosis not present

## 2021-04-15 DIAGNOSIS — Z125 Encounter for screening for malignant neoplasm of prostate: Secondary | ICD-10-CM | POA: Diagnosis not present

## 2021-04-22 DIAGNOSIS — Z0000 Encounter for general adult medical examination without abnormal findings: Secondary | ICD-10-CM | POA: Diagnosis not present

## 2021-04-22 DIAGNOSIS — I10 Essential (primary) hypertension: Secondary | ICD-10-CM | POA: Diagnosis not present

## 2021-04-22 DIAGNOSIS — Z1212 Encounter for screening for malignant neoplasm of rectum: Secondary | ICD-10-CM | POA: Diagnosis not present

## 2021-04-22 DIAGNOSIS — I2510 Atherosclerotic heart disease of native coronary artery without angina pectoris: Secondary | ICD-10-CM | POA: Diagnosis not present

## 2021-04-22 DIAGNOSIS — E103591 Type 1 diabetes mellitus with proliferative diabetic retinopathy without macular edema, right eye: Secondary | ICD-10-CM | POA: Diagnosis not present

## 2021-04-22 DIAGNOSIS — R82998 Other abnormal findings in urine: Secondary | ICD-10-CM | POA: Diagnosis not present

## 2021-04-22 DIAGNOSIS — E1039 Type 1 diabetes mellitus with other diabetic ophthalmic complication: Secondary | ICD-10-CM | POA: Diagnosis not present

## 2021-05-21 DIAGNOSIS — Z4681 Encounter for fitting and adjustment of insulin pump: Secondary | ICD-10-CM | POA: Diagnosis not present

## 2021-05-21 DIAGNOSIS — I10 Essential (primary) hypertension: Secondary | ICD-10-CM | POA: Diagnosis not present

## 2021-05-21 DIAGNOSIS — I2510 Atherosclerotic heart disease of native coronary artery without angina pectoris: Secondary | ICD-10-CM | POA: Diagnosis not present

## 2021-05-21 DIAGNOSIS — E1039 Type 1 diabetes mellitus with other diabetic ophthalmic complication: Secondary | ICD-10-CM | POA: Diagnosis not present

## 2021-06-29 DIAGNOSIS — E1039 Type 1 diabetes mellitus with other diabetic ophthalmic complication: Secondary | ICD-10-CM | POA: Diagnosis not present

## 2021-07-08 DIAGNOSIS — E109 Type 1 diabetes mellitus without complications: Secondary | ICD-10-CM | POA: Diagnosis not present

## 2021-08-19 DIAGNOSIS — H5201 Hypermetropia, right eye: Secondary | ICD-10-CM | POA: Diagnosis not present

## 2021-08-19 DIAGNOSIS — H5213 Myopia, bilateral: Secondary | ICD-10-CM | POA: Diagnosis not present

## 2021-08-23 DIAGNOSIS — E1039 Type 1 diabetes mellitus with other diabetic ophthalmic complication: Secondary | ICD-10-CM | POA: Diagnosis not present

## 2021-08-23 DIAGNOSIS — E785 Hyperlipidemia, unspecified: Secondary | ICD-10-CM | POA: Diagnosis not present

## 2021-08-23 DIAGNOSIS — I10 Essential (primary) hypertension: Secondary | ICD-10-CM | POA: Diagnosis not present

## 2021-08-23 DIAGNOSIS — I2510 Atherosclerotic heart disease of native coronary artery without angina pectoris: Secondary | ICD-10-CM | POA: Diagnosis not present

## 2021-09-05 DIAGNOSIS — H18411 Arcus senilis, right eye: Secondary | ICD-10-CM | POA: Diagnosis not present

## 2021-09-05 DIAGNOSIS — H02402 Unspecified ptosis of left eyelid: Secondary | ICD-10-CM | POA: Diagnosis not present

## 2021-09-05 DIAGNOSIS — E113291 Type 2 diabetes mellitus with mild nonproliferative diabetic retinopathy without macular edema, right eye: Secondary | ICD-10-CM | POA: Diagnosis not present

## 2021-09-05 DIAGNOSIS — Z961 Presence of intraocular lens: Secondary | ICD-10-CM | POA: Diagnosis not present

## 2021-09-24 DIAGNOSIS — E109 Type 1 diabetes mellitus without complications: Secondary | ICD-10-CM | POA: Diagnosis not present

## 2021-10-08 DIAGNOSIS — E1039 Type 1 diabetes mellitus with other diabetic ophthalmic complication: Secondary | ICD-10-CM | POA: Diagnosis not present

## 2021-10-28 ENCOUNTER — Encounter (INDEPENDENT_AMBULATORY_CARE_PROVIDER_SITE_OTHER): Payer: Medicare Other | Admitting: Ophthalmology

## 2021-10-29 ENCOUNTER — Encounter (INDEPENDENT_AMBULATORY_CARE_PROVIDER_SITE_OTHER): Payer: Medicare Other | Admitting: Ophthalmology

## 2021-10-29 ENCOUNTER — Other Ambulatory Visit: Payer: Self-pay

## 2021-10-29 ENCOUNTER — Encounter (INDEPENDENT_AMBULATORY_CARE_PROVIDER_SITE_OTHER): Payer: Self-pay | Admitting: Ophthalmology

## 2021-10-29 ENCOUNTER — Ambulatory Visit (INDEPENDENT_AMBULATORY_CARE_PROVIDER_SITE_OTHER): Payer: Medicare Other | Admitting: Ophthalmology

## 2021-10-29 DIAGNOSIS — E103551 Type 1 diabetes mellitus with stable proliferative diabetic retinopathy, right eye: Secondary | ICD-10-CM

## 2021-10-29 DIAGNOSIS — H44522 Atrophy of globe, left eye: Secondary | ICD-10-CM

## 2021-10-29 DIAGNOSIS — E1039 Type 1 diabetes mellitus with other diabetic ophthalmic complication: Secondary | ICD-10-CM | POA: Diagnosis not present

## 2021-10-29 DIAGNOSIS — I2510 Atherosclerotic heart disease of native coronary artery without angina pectoris: Secondary | ICD-10-CM | POA: Diagnosis not present

## 2021-10-29 DIAGNOSIS — I10 Essential (primary) hypertension: Secondary | ICD-10-CM | POA: Diagnosis not present

## 2021-10-29 DIAGNOSIS — Z4681 Encounter for fitting and adjustment of insulin pump: Secondary | ICD-10-CM | POA: Diagnosis not present

## 2021-10-29 NOTE — Assessment & Plan Note (Signed)

## 2021-10-29 NOTE — Progress Notes (Signed)
10/29/2021     CHIEF COMPLAINT Patient presents for  Chief Complaint  Patient presents with   Retina Follow Up      HISTORY OF PRESENT ILLNESS: Scott Mosley is a 60 y.o. male who presents to the clinic today for:   HPI     Retina Follow Up           Diagnosis: Diabetic Retinopathy   Laterality: right eye   Onset: 1 year ago   Severity: moderate   Duration: 1 year   Course: stable   MD Performed: performed the HPI with the patient and updated documentation appropriately         Comments   1 yr fu OU oct. Patient states vision is stable and unchanged since last visit. Denies any new floaters or FOL. LBS: 115 this morning. Last A1C: 6.4 3 months ago. Pt goes again this afternoon for A1C check.      Last edited by Nelva Nay on 10/29/2021  8:56 AM.      Referring physician: Adrian Prince, MD [address] [place],  [place] [address]  HISTORICAL INFORMATION:   Selected notes from the MEDICAL RECORD NUMBER    Lab Results  Component Value Date   HGBA1C 7.7 (H) 11/03/2017     CURRENT MEDICATIONS: No current outpatient medications on file. (Ophthalmic Drugs)   No current facility-administered medications for this visit. (Ophthalmic Drugs)   Current Outpatient Medications (Other)  Medication Sig   aspirin EC 81 MG tablet Take 81 mg by mouth daily.   canagliflozin (INVOKANA) 100 MG TABS tablet Take by mouth daily before breakfast.   chlorpheniramine-HYDROcodone (TUSSIONEX PENNKINETIC ER) 10-8 MG/5ML SUER Take 5 mLs by mouth every 12 (twelve) hours as needed for cough.   insulin lispro (HUMALOG) 100 UNIT/ML injection Inject into the skin 3 (three) times daily before meals. Cont- insulin pump   levofloxacin (LEVAQUIN) 750 MG tablet Take 1 tablet (750 mg total) by mouth daily.   metoprolol tartrate (LOPRESSOR) 25 MG tablet TAKE 1 TABLET(25 MG) BY MOUTH TWICE DAILY   pravastatin (PRAVACHOL) 80 MG tablet Take 80 mg by mouth daily.   ramipril (ALTACE) 10  MG capsule Take 10 mg by mouth daily.   No current facility-administered medications for this visit. (Other)      REVIEW OF SYSTEMS:    ALLERGIES No Known Allergies  PAST MEDICAL HISTORY Past Medical History:  Diagnosis Date   Diabetes mellitus without complication (HCC)    History reviewed. No pertinent surgical history.  FAMILY HISTORY History reviewed. No pertinent family history.  SOCIAL HISTORY Social History   Tobacco Use   Smoking status: Never   Smokeless tobacco: Never  Vaping Use   Vaping Use: Never used  Substance Use Topics   Alcohol use: No    Alcohol/week: 0.0 standard drinks   Drug use: No         OPHTHALMIC EXAM:  Base Eye Exam     Visual Acuity (ETDRS)       Right Left   Dist cc 20/20 -1 NLP         Tonometry (Tonopen, 8:58 AM)       Right Left   Pressure 19 Unable         Pupils       Dark Light Shape React APD   Right 4 3 Round Brisk None   Left              Visual Fields (Counting fingers)  Left Right   Restrictions Total superior temporal, inferior temporal, superior nasal, inferior nasal deficiencies Partial outer superior temporal, superior nasal deficiencies         Extraocular Movement       Right Left    Full Full         Neuro/Psych     Oriented x3: Yes   Mood/Affect: Normal         Dilation     Right eye: 1.0% Mydriacyl, 2.5% Phenylephrine @ 8:58 AM           Slit Lamp and Fundus Exam     External Exam       Right Left   External Normal Normal         Slit Lamp Exam       Right Left   Lids/Lashes Normal Normal   Conjunctiva/Sclera White and quiet White and quiet   Cornea Clear Clear   Anterior Chamber Deep and quiet Deep and quiet   Iris Round and reactive Round and reactive   Anterior Vitreous Normal Normal         Fundus Exam       Right Left   Posterior Vitreous Clear, vitrectomized    Disc Normal    C/D Ratio 0.4    Macula Microaneurysms, no  clinically significant macular edema, No topo distortion No views posteriorly   Vessels PDR-quiet    Periphery Good PRP, no active disease             IMAGING AND PROCEDURES  Imaging and Procedures for 10/29/21  OCT, Retina - OU - Both Eyes       Right Eye Quality was good. Scan locations included subfoveal. Central Foveal Thickness: 298. Progression has improved.   Notes No active maculopathy, some 4 years post vitrectomy membrane peel, no recurrence of CME nor ERM, OD  No views OS             ASSESSMENT/PLAN:  Controlled type 1 diabetes mellitus with stable proliferative retinopathy of right eye (HCC) The nature of regressed proliferative diabetic retinopathy was discussed with the patient. The patient was advised to maintain good glucose, blood pressure, monitor kidney function and serum lipid control as advised by personal physician. Rare risk for reactivation of progression exist with untreated severe anemia, untreated renal failure, untreated heart failure, and smoking. Complete avoidance of smoking was recommended. The chance of recurrent proliferative diabetic retinopathy was discussed as well as the chance of vitreous hemorrhage for which further treatments may be necessary.   Explained to the patient that the quiescent  proliferative diabetic retinopathy disease is unlikely to ever worsen.  Worsening factors would include however severe anemia, hypertension out-of-control or impending renal failure.  Atrophy of globe of left eye Stable OS     ICD-10-CM   1. Controlled type 1 diabetes mellitus with stable proliferative retinopathy of right eye (HCC)  E10.3551 OCT, Retina - OU - Both Eyes    2. Atrophy of globe of left eye  H44.522       1.  OD, stable retinal condition with regressed PDR.  Currently no threat of lesion creep into the macular foveal region.  2.   type 1 diabetes mellitus with PDR, now quiescent for over 25 years  3.  Ophthalmic Meds  Ordered this visit:  No orders of the defined types were placed in this encounter.      Return in about 1 year (around 10/29/2022) for dilate, OD, COLOR FP.  There are no Patient Instructions on file for this visit.   Explained the diagnoses, plan, and follow up with the patient and they expressed understanding.  Patient expressed understanding of the importance of proper follow up care.   Alford Highland Welford Christmas M.D. Diseases & Surgery of the Retina and Vitreous [facility] 10/29/21     Abbreviations: M myopia (nearsighted); A astigmatism; H hyperopia (farsighted); P presbyopia; Mrx spectacle prescription;  CTL contact lenses; OD right eye; OS left eye; OU both eyes  XT exotropia; ET esotropia; PEK punctate epithelial keratitis; PEE punctate epithelial erosions; DES dry eye syndrome; MGD meibomian gland dysfunction; ATs artificial tears; PFAT's preservative free artificial tears; NSC nuclear sclerotic cataract; PSC posterior subcapsular cataract; ERM epi-retinal membrane; PVD posterior vitreous detachment; RD retinal detachment; DM diabetes mellitus; DR diabetic retinopathy; NPDR non-proliferative diabetic retinopathy; PDR proliferative diabetic retinopathy; CSME clinically significant macular edema; DME diabetic macular edema; dbh dot blot hemorrhages; CWS cotton wool spot; POAG primary open angle glaucoma; C/D cup-to-disc ratio; HVF humphrey visual field; GVF goldmann visual field; OCT optical coherence tomography; IOP intraocular pressure; BRVO Branch retinal vein occlusion; CRVO central retinal vein occlusion; CRAO central retinal artery occlusion; BRAO branch retinal artery occlusion; RT retinal tear; SB scleral buckle; PPV pars plana vitrectomy; VH Vitreous hemorrhage; PRP panretinal laser photocoagulation; IVK intravitreal kenalog; VMT vitreomacular traction; MH Macular hole;  NVD neovascularization of the disc; NVE neovascularization elsewhere; AREDS age related eye disease  study; ARMD age related macular degeneration; POAG primary open angle glaucoma; EBMD epithelial/anterior basement membrane dystrophy; ACIOL anterior chamber intraocular lens; IOL intraocular lens; PCIOL posterior chamber intraocular lens; Phaco/IOL phacoemulsification with intraocular lens placement; PRK photorefractive keratectomy; LASIK laser assisted in situ keratomileusis; HTN hypertension; DM diabetes mellitus; COPD chronic obstructive pulmonary disease

## 2021-10-29 NOTE — Assessment & Plan Note (Signed)
Stable OS 

## 2021-11-17 ENCOUNTER — Other Ambulatory Visit: Payer: Self-pay | Admitting: Cardiovascular Disease

## 2021-11-18 ENCOUNTER — Other Ambulatory Visit: Payer: Self-pay | Admitting: Cardiovascular Disease

## 2021-12-17 ENCOUNTER — Other Ambulatory Visit: Payer: Self-pay | Admitting: Cardiovascular Disease

## 2021-12-25 DIAGNOSIS — E103591 Type 1 diabetes mellitus with proliferative diabetic retinopathy without macular edema, right eye: Secondary | ICD-10-CM | POA: Diagnosis not present

## 2021-12-25 DIAGNOSIS — I10 Essential (primary) hypertension: Secondary | ICD-10-CM | POA: Diagnosis not present

## 2021-12-25 DIAGNOSIS — I2510 Atherosclerotic heart disease of native coronary artery without angina pectoris: Secondary | ICD-10-CM | POA: Diagnosis not present

## 2021-12-25 DIAGNOSIS — E1039 Type 1 diabetes mellitus with other diabetic ophthalmic complication: Secondary | ICD-10-CM | POA: Diagnosis not present

## 2022-01-24 DIAGNOSIS — E109 Type 1 diabetes mellitus without complications: Secondary | ICD-10-CM | POA: Diagnosis not present

## 2022-01-28 ENCOUNTER — Other Ambulatory Visit: Payer: Self-pay

## 2022-01-28 ENCOUNTER — Ambulatory Visit: Payer: Medicare Other | Admitting: Cardiovascular Disease

## 2022-01-28 ENCOUNTER — Encounter: Payer: Self-pay | Admitting: Cardiovascular Disease

## 2022-01-28 DIAGNOSIS — I4510 Unspecified right bundle-branch block: Secondary | ICD-10-CM | POA: Insufficient documentation

## 2022-01-28 DIAGNOSIS — E782 Mixed hyperlipidemia: Secondary | ICD-10-CM

## 2022-01-28 MED ORDER — METOPROLOL TARTRATE 25 MG PO TABS: ORAL_TABLET | ORAL | 3 refills | Status: DC

## 2022-01-28 NOTE — Assessment & Plan Note (Signed)
Chronic. 

## 2022-01-28 NOTE — Progress Notes (Signed)
? ? ? ?01/28/2022 ?Scott Mosley   ?10-17-1961  ?[id number] ? ?Primary Physician Adrian Prince, MD ?Primary Cardiologist: Runell Gess MD [place], [place] ? ?HPI:  Scott Mosley is a 61 y.o.  married Caucasian male father of 2, grandfather and one grandchild who I last saw in the office 12/29/2018. He was hospitalized 11/03/17 for 4 days with DKA and mild troponin leak along with brief episode of PAF with RVR. So the problems include type 1 diabetes since age 25 on an insulin pump and hyperlipidemia on statin therapy followed by his PCP. Apparently he had an insulin pump malfunction which may have contributed to his hospitalization. He was discharged home on Cardizem and metoprolol and his ACE inhibitor was discontinued. Since discharge she is starting to regain his strength. He denies chest pain or shortness of breath. Never had a heart attack or stroke. His father did have a myocardial infarction. A 2-D echo performed during his hospitalization was essentially normal. ? ?Since I saw him 3 years ago he is remained stable.  He denies chest pain or shortness of breath. ? ? ?Current Meds  ?Medication Sig  ? aspirin EC 81 MG tablet Take 81 mg by mouth daily.  ? empagliflozin (JARDIANCE) 10 MG TABS tablet Take 1 tablet by mouth daily.  ? insulin lispro (HUMALOG) 100 UNIT/ML injection Inject into the skin 3 (three) times daily before meals. Cont- insulin pump  ? levofloxacin (LEVAQUIN) 750 MG tablet Take 1 tablet (750 mg total) by mouth daily.  ? metoprolol tartrate (LOPRESSOR) 25 MG tablet TAKE 1 TABLET BY MOUTH TWICE DAILY. SCHEDULE OFFICE VISIT FOR FUTURE REFILLS. 2ND ATTEMPT  ? pravastatin (PRAVACHOL) 80 MG tablet Take 80 mg by mouth daily.  ? ramipril (ALTACE) 10 MG capsule Take 10 mg by mouth daily.  ?  ? ?No Known Allergies ? ?Social History  ? ?Socioeconomic History  ? Marital status: Married  ?  Spouse name: Not on file  ? Number of children: Not on file  ? Years of education: Not on file  ? Highest  education level: Not on file  ?Occupational History  ? Not on file  ?Tobacco Use  ? Smoking status: Never  ? Smokeless tobacco: Never  ?Vaping Use  ? Vaping Use: Never used  ?Substance and Sexual Activity  ? Alcohol use: No  ?  Alcohol/week: 0.0 standard drinks  ? Drug use: No  ? Sexual activity: Never  ?Other Topics Concern  ? Not on file  ?Social History Narrative  ? Not on file  ? ?Social Determinants of Health  ? ?Financial Resource Strain: Not on file  ?Food Insecurity: Not on file  ?Transportation Needs: Not on file  ?Physical Activity: Not on file  ?Stress: Not on file  ?Social Connections: Not on file  ?Intimate Partner Violence: Not on file  ?  ? ?Review of Systems: ?General: negative for chills, fever, night sweats or weight changes.  ?Cardiovascular: negative for chest pain, dyspnea on exertion, edema, orthopnea, palpitations, paroxysmal nocturnal dyspnea or shortness of breath ?Dermatological: negative for rash ?Respiratory: negative for cough or wheezing ?Urologic: negative for hematuria ?Abdominal: negative for nausea, vomiting, diarrhea, bright red blood per rectum, melena, or hematemesis ?Neurologic: negative for visual changes, syncope, or dizziness ?All other systems reviewed and are otherwise negative except as noted above. ? ? ? ?Blood pressure (!) 168/72, pulse 82, height 5\' 9"  (1.753 m), weight 213 lb 9.6 oz (96.9 kg), SpO2 97 %.  ?General appearance: alert  and no distress ?Neck: no adenopathy, no carotid bruit, no JVD, supple, symmetrical, trachea midline, and thyroid not enlarged, symmetric, no tenderness/mass/nodules ?Lungs: clear to auscultation bilaterally ?Heart: regular rate and rhythm, S1, S2 normal, no murmur, click, rub or gallop ?Extremities: extremities normal, atraumatic, no cyanosis or edema ?Pulses: 2+ and symmetric ?Skin: Skin color, texture, turgor normal. No rashes or lesions ?Neurologic: Grossly normal ? ?EKG sinus rhythm at 82 with right bundle branch block and left axis  deviation.  I personally reviewed this EKG. ? ?ASSESSMENT AND PLAN:  ? ?Hyperlipemia ?History of hyperlipidemia on statin therapy with lipid profile performed 04/15/2021 revealing total cholesterol 182, LDL 114 and HDL of 55, followed by his PCP. ? ? ? ? ?Lorretta Harp MD FACP,FACC,FAHA, FSCAI ?01/28/2022 ?10:02 AM ?

## 2022-01-28 NOTE — Patient Instructions (Signed)

## 2022-01-28 NOTE — Assessment & Plan Note (Signed)
History of hyperlipidemia on statin therapy with lipid profile performed 04/15/2021 revealing total cholesterol 182, LDL 114 and HDL of 55, followed by his PCP. ?

## 2022-02-03 DIAGNOSIS — E1039 Type 1 diabetes mellitus with other diabetic ophthalmic complication: Secondary | ICD-10-CM | POA: Diagnosis not present

## 2022-02-11 DIAGNOSIS — I10 Essential (primary) hypertension: Secondary | ICD-10-CM | POA: Diagnosis not present

## 2022-02-11 DIAGNOSIS — I2510 Atherosclerotic heart disease of native coronary artery without angina pectoris: Secondary | ICD-10-CM | POA: Diagnosis not present

## 2022-02-11 DIAGNOSIS — E1039 Type 1 diabetes mellitus with other diabetic ophthalmic complication: Secondary | ICD-10-CM | POA: Diagnosis not present

## 2022-02-11 DIAGNOSIS — Z4681 Encounter for fitting and adjustment of insulin pump: Secondary | ICD-10-CM | POA: Diagnosis not present

## 2022-05-05 DIAGNOSIS — E1039 Type 1 diabetes mellitus with other diabetic ophthalmic complication: Secondary | ICD-10-CM | POA: Diagnosis not present

## 2022-05-05 DIAGNOSIS — I2510 Atherosclerotic heart disease of native coronary artery without angina pectoris: Secondary | ICD-10-CM | POA: Diagnosis not present

## 2022-05-05 DIAGNOSIS — Z0000 Encounter for general adult medical examination without abnormal findings: Secondary | ICD-10-CM | POA: Diagnosis not present

## 2022-05-05 DIAGNOSIS — I10 Essential (primary) hypertension: Secondary | ICD-10-CM | POA: Diagnosis not present

## 2022-06-09 DIAGNOSIS — E109 Type 1 diabetes mellitus without complications: Secondary | ICD-10-CM | POA: Diagnosis not present

## 2022-07-08 DIAGNOSIS — I5189 Other ill-defined heart diseases: Secondary | ICD-10-CM | POA: Diagnosis not present

## 2022-07-08 DIAGNOSIS — E1039 Type 1 diabetes mellitus with other diabetic ophthalmic complication: Secondary | ICD-10-CM | POA: Diagnosis not present

## 2022-07-08 DIAGNOSIS — I2510 Atherosclerotic heart disease of native coronary artery without angina pectoris: Secondary | ICD-10-CM | POA: Diagnosis not present

## 2022-07-08 DIAGNOSIS — I10 Essential (primary) hypertension: Secondary | ICD-10-CM | POA: Diagnosis not present

## 2022-07-15 ENCOUNTER — Other Ambulatory Visit: Payer: Self-pay | Admitting: Cardiovascular Disease

## 2022-07-29 ENCOUNTER — Ambulatory Visit: Payer: Self-pay

## 2022-07-29 NOTE — Patient Outreach (Signed)
  Care Coordination   Initial Visit Note   07/29/2022 Name: Scott Mosley MRN: [id number] DOB: [birth year]  Scott Mosley is a 61 y.o. year old male who sees [place], Annie Main, MD for primary care. I spoke with  Scott Mosley by phone today.  What matters to the patients health and wellness today?  Patient declined to participate in today's call.   Goals Addressed   None     SDOH assessments and interventions completed:  No     Care Coordination Interventions Activated:  No  Care Coordination Interventions:  No, not indicated   Follow up plan: No further intervention required.   Encounter Outcome:  Pt. Refused   Daneen Schick, BSW, CDP Social Worker, Certified Dementia Practitioner [facility]  Care Coordination [phone]

## 2022-09-04 DIAGNOSIS — E109 Type 1 diabetes mellitus without complications: Secondary | ICD-10-CM | POA: Diagnosis not present

## 2022-09-09 DIAGNOSIS — Z961 Presence of intraocular lens: Secondary | ICD-10-CM | POA: Diagnosis not present

## 2022-09-09 DIAGNOSIS — H02402 Unspecified ptosis of left eyelid: Secondary | ICD-10-CM | POA: Diagnosis not present

## 2022-09-09 DIAGNOSIS — H18411 Arcus senilis, right eye: Secondary | ICD-10-CM | POA: Diagnosis not present

## 2022-09-09 DIAGNOSIS — E113291 Type 2 diabetes mellitus with mild nonproliferative diabetic retinopathy without macular edema, right eye: Secondary | ICD-10-CM | POA: Diagnosis not present

## 2022-09-18 DIAGNOSIS — E103591 Type 1 diabetes mellitus with proliferative diabetic retinopathy without macular edema, right eye: Secondary | ICD-10-CM | POA: Diagnosis not present

## 2022-09-18 DIAGNOSIS — I10 Essential (primary) hypertension: Secondary | ICD-10-CM | POA: Diagnosis not present

## 2022-09-18 DIAGNOSIS — I2510 Atherosclerotic heart disease of native coronary artery without angina pectoris: Secondary | ICD-10-CM | POA: Diagnosis not present

## 2022-09-18 DIAGNOSIS — E1039 Type 1 diabetes mellitus with other diabetic ophthalmic complication: Secondary | ICD-10-CM | POA: Diagnosis not present

## 2022-10-30 ENCOUNTER — Encounter (INDEPENDENT_AMBULATORY_CARE_PROVIDER_SITE_OTHER): Payer: Medicare Other | Admitting: Ophthalmology

## 2022-11-20 DIAGNOSIS — E1039 Type 1 diabetes mellitus with other diabetic ophthalmic complication: Secondary | ICD-10-CM | POA: Diagnosis not present

## 2022-11-20 DIAGNOSIS — I10 Essential (primary) hypertension: Secondary | ICD-10-CM | POA: Diagnosis not present

## 2022-12-05 ENCOUNTER — Other Ambulatory Visit: Payer: Self-pay | Admitting: Cardiovascular Disease

## 2022-12-23 DIAGNOSIS — E109 Type 1 diabetes mellitus without complications: Secondary | ICD-10-CM | POA: Diagnosis not present

## 2022-12-25 DIAGNOSIS — L814 Other melanin hyperpigmentation: Secondary | ICD-10-CM | POA: Diagnosis not present

## 2022-12-25 DIAGNOSIS — D2262 Melanocytic nevi of left upper limb, including shoulder: Secondary | ICD-10-CM | POA: Diagnosis not present

## 2022-12-25 DIAGNOSIS — L570 Actinic keratosis: Secondary | ICD-10-CM | POA: Diagnosis not present

## 2022-12-25 DIAGNOSIS — L821 Other seborrheic keratosis: Secondary | ICD-10-CM | POA: Diagnosis not present

## 2022-12-25 DIAGNOSIS — D225 Melanocytic nevi of trunk: Secondary | ICD-10-CM | POA: Diagnosis not present

## 2022-12-31 DIAGNOSIS — E109 Type 1 diabetes mellitus without complications: Secondary | ICD-10-CM | POA: Diagnosis not present

## 2023-01-07 DIAGNOSIS — E109 Type 1 diabetes mellitus without complications: Secondary | ICD-10-CM | POA: Diagnosis not present

## 2023-01-08 DIAGNOSIS — E109 Type 1 diabetes mellitus without complications: Secondary | ICD-10-CM | POA: Diagnosis not present

## 2023-01-08 DIAGNOSIS — L84 Corns and callosities: Secondary | ICD-10-CM | POA: Diagnosis not present

## 2023-01-08 DIAGNOSIS — L820 Inflamed seborrheic keratosis: Secondary | ICD-10-CM | POA: Diagnosis not present

## 2023-01-12 DIAGNOSIS — E785 Hyperlipidemia, unspecified: Secondary | ICD-10-CM | POA: Diagnosis not present

## 2023-01-12 DIAGNOSIS — I10 Essential (primary) hypertension: Secondary | ICD-10-CM | POA: Diagnosis not present

## 2023-01-12 DIAGNOSIS — Z125 Encounter for screening for malignant neoplasm of prostate: Secondary | ICD-10-CM | POA: Diagnosis not present

## 2023-01-12 DIAGNOSIS — E1039 Type 1 diabetes mellitus with other diabetic ophthalmic complication: Secondary | ICD-10-CM | POA: Diagnosis not present

## 2023-01-13 DIAGNOSIS — E1039 Type 1 diabetes mellitus with other diabetic ophthalmic complication: Secondary | ICD-10-CM | POA: Diagnosis not present

## 2023-01-14 DIAGNOSIS — E109 Type 1 diabetes mellitus without complications: Secondary | ICD-10-CM | POA: Diagnosis not present

## 2023-01-19 DIAGNOSIS — E1039 Type 1 diabetes mellitus with other diabetic ophthalmic complication: Secondary | ICD-10-CM | POA: Diagnosis not present

## 2023-01-19 DIAGNOSIS — I2510 Atherosclerotic heart disease of native coronary artery without angina pectoris: Secondary | ICD-10-CM | POA: Diagnosis not present

## 2023-01-19 DIAGNOSIS — R82998 Other abnormal findings in urine: Secondary | ICD-10-CM | POA: Diagnosis not present

## 2023-01-19 DIAGNOSIS — I10 Essential (primary) hypertension: Secondary | ICD-10-CM | POA: Diagnosis not present

## 2023-01-19 DIAGNOSIS — Z0000 Encounter for general adult medical examination without abnormal findings: Secondary | ICD-10-CM | POA: Diagnosis not present

## 2023-01-19 DIAGNOSIS — E103591 Type 1 diabetes mellitus with proliferative diabetic retinopathy without macular edema, right eye: Secondary | ICD-10-CM | POA: Diagnosis not present

## 2023-01-21 DIAGNOSIS — E109 Type 1 diabetes mellitus without complications: Secondary | ICD-10-CM | POA: Diagnosis not present

## 2023-01-26 DIAGNOSIS — K760 Fatty (change of) liver, not elsewhere classified: Secondary | ICD-10-CM | POA: Diagnosis not present

## 2023-01-26 DIAGNOSIS — I10 Essential (primary) hypertension: Secondary | ICD-10-CM | POA: Diagnosis not present

## 2023-01-26 DIAGNOSIS — I2510 Atherosclerotic heart disease of native coronary artery without angina pectoris: Secondary | ICD-10-CM | POA: Diagnosis not present

## 2023-01-26 DIAGNOSIS — E1039 Type 1 diabetes mellitus with other diabetic ophthalmic complication: Secondary | ICD-10-CM | POA: Diagnosis not present

## 2023-01-28 DIAGNOSIS — E109 Type 1 diabetes mellitus without complications: Secondary | ICD-10-CM | POA: Diagnosis not present

## 2023-02-04 DIAGNOSIS — E109 Type 1 diabetes mellitus without complications: Secondary | ICD-10-CM | POA: Diagnosis not present

## 2023-02-07 DIAGNOSIS — E1039 Type 1 diabetes mellitus with other diabetic ophthalmic complication: Secondary | ICD-10-CM | POA: Diagnosis not present

## 2023-02-11 DIAGNOSIS — E109 Type 1 diabetes mellitus without complications: Secondary | ICD-10-CM | POA: Diagnosis not present

## 2023-02-18 DIAGNOSIS — E109 Type 1 diabetes mellitus without complications: Secondary | ICD-10-CM | POA: Diagnosis not present

## 2023-02-25 DIAGNOSIS — H179 Unspecified corneal scar and opacity: Secondary | ICD-10-CM | POA: Diagnosis not present

## 2023-02-25 DIAGNOSIS — E103551 Type 1 diabetes mellitus with stable proliferative diabetic retinopathy, right eye: Secondary | ICD-10-CM | POA: Diagnosis not present

## 2023-02-25 DIAGNOSIS — E109 Type 1 diabetes mellitus without complications: Secondary | ICD-10-CM | POA: Diagnosis not present

## 2023-02-25 DIAGNOSIS — H1712 Central corneal opacity, left eye: Secondary | ICD-10-CM | POA: Diagnosis not present

## 2023-02-25 DIAGNOSIS — H44522 Atrophy of globe, left eye: Secondary | ICD-10-CM | POA: Diagnosis not present

## 2023-02-25 DIAGNOSIS — Z9889 Other specified postprocedural states: Secondary | ICD-10-CM | POA: Diagnosis not present

## 2023-03-04 DIAGNOSIS — E109 Type 1 diabetes mellitus without complications: Secondary | ICD-10-CM | POA: Diagnosis not present

## 2023-03-09 DIAGNOSIS — E1039 Type 1 diabetes mellitus with other diabetic ophthalmic complication: Secondary | ICD-10-CM | POA: Diagnosis not present

## 2023-03-11 DIAGNOSIS — E109 Type 1 diabetes mellitus without complications: Secondary | ICD-10-CM | POA: Diagnosis not present

## 2023-03-18 DIAGNOSIS — E109 Type 1 diabetes mellitus without complications: Secondary | ICD-10-CM | POA: Diagnosis not present

## 2023-03-25 DIAGNOSIS — D492 Neoplasm of unspecified behavior of bone, soft tissue, and skin: Secondary | ICD-10-CM | POA: Diagnosis not present

## 2023-03-25 DIAGNOSIS — C44329 Squamous cell carcinoma of skin of other parts of face: Secondary | ICD-10-CM | POA: Diagnosis not present

## 2023-03-25 DIAGNOSIS — L570 Actinic keratosis: Secondary | ICD-10-CM | POA: Diagnosis not present

## 2023-03-25 DIAGNOSIS — D2371 Other benign neoplasm of skin of right lower limb, including hip: Secondary | ICD-10-CM | POA: Diagnosis not present

## 2023-03-25 DIAGNOSIS — L814 Other melanin hyperpigmentation: Secondary | ICD-10-CM | POA: Diagnosis not present

## 2023-03-25 DIAGNOSIS — L821 Other seborrheic keratosis: Secondary | ICD-10-CM | POA: Diagnosis not present

## 2023-03-25 DIAGNOSIS — L568 Other specified acute skin changes due to ultraviolet radiation: Secondary | ICD-10-CM | POA: Diagnosis not present

## 2023-03-25 DIAGNOSIS — E109 Type 1 diabetes mellitus without complications: Secondary | ICD-10-CM | POA: Diagnosis not present

## 2023-03-26 DIAGNOSIS — I2510 Atherosclerotic heart disease of native coronary artery without angina pectoris: Secondary | ICD-10-CM | POA: Diagnosis not present

## 2023-03-26 DIAGNOSIS — E1039 Type 1 diabetes mellitus with other diabetic ophthalmic complication: Secondary | ICD-10-CM | POA: Diagnosis not present

## 2023-03-26 DIAGNOSIS — I10 Essential (primary) hypertension: Secondary | ICD-10-CM | POA: Diagnosis not present

## 2023-03-26 DIAGNOSIS — I5189 Other ill-defined heart diseases: Secondary | ICD-10-CM | POA: Diagnosis not present

## 2023-04-09 DIAGNOSIS — E109 Type 1 diabetes mellitus without complications: Secondary | ICD-10-CM | POA: Diagnosis not present

## 2023-04-09 DIAGNOSIS — E1039 Type 1 diabetes mellitus with other diabetic ophthalmic complication: Secondary | ICD-10-CM | POA: Diagnosis not present

## 2023-04-16 DIAGNOSIS — E109 Type 1 diabetes mellitus without complications: Secondary | ICD-10-CM | POA: Diagnosis not present

## 2023-04-21 DIAGNOSIS — C44329 Squamous cell carcinoma of skin of other parts of face: Secondary | ICD-10-CM | POA: Diagnosis not present

## 2023-04-23 DIAGNOSIS — E109 Type 1 diabetes mellitus without complications: Secondary | ICD-10-CM | POA: Diagnosis not present

## 2023-04-30 DIAGNOSIS — E109 Type 1 diabetes mellitus without complications: Secondary | ICD-10-CM | POA: Diagnosis not present

## 2023-05-06 DIAGNOSIS — L568 Other specified acute skin changes due to ultraviolet radiation: Secondary | ICD-10-CM | POA: Diagnosis not present

## 2023-05-07 DIAGNOSIS — E109 Type 1 diabetes mellitus without complications: Secondary | ICD-10-CM | POA: Diagnosis not present

## 2023-05-09 DIAGNOSIS — E109 Type 1 diabetes mellitus without complications: Secondary | ICD-10-CM | POA: Diagnosis not present

## 2023-05-14 DIAGNOSIS — E109 Type 1 diabetes mellitus without complications: Secondary | ICD-10-CM | POA: Diagnosis not present

## 2023-05-20 DIAGNOSIS — I480 Paroxysmal atrial fibrillation: Secondary | ICD-10-CM | POA: Diagnosis not present

## 2023-05-20 DIAGNOSIS — E103591 Type 1 diabetes mellitus with proliferative diabetic retinopathy without macular edema, right eye: Secondary | ICD-10-CM | POA: Diagnosis not present

## 2023-05-21 DIAGNOSIS — E109 Type 1 diabetes mellitus without complications: Secondary | ICD-10-CM | POA: Diagnosis not present

## 2023-05-28 DIAGNOSIS — E109 Type 1 diabetes mellitus without complications: Secondary | ICD-10-CM | POA: Diagnosis not present

## 2023-06-03 DIAGNOSIS — H5442A5 Blindness left eye category 5, normal vision right eye: Secondary | ICD-10-CM | POA: Diagnosis not present

## 2023-06-03 DIAGNOSIS — H44522 Atrophy of globe, left eye: Secondary | ICD-10-CM | POA: Diagnosis not present

## 2023-06-03 DIAGNOSIS — H18892 Other specified disorders of cornea, left eye: Secondary | ICD-10-CM | POA: Diagnosis not present

## 2023-06-03 DIAGNOSIS — H0270 Unspecified degenerative disorders of eyelid and periocular area: Secondary | ICD-10-CM | POA: Diagnosis not present

## 2023-06-03 DIAGNOSIS — H50112 Monocular exotropia, left eye: Secondary | ICD-10-CM | POA: Diagnosis not present

## 2023-06-03 DIAGNOSIS — H18422 Band keratopathy, left eye: Secondary | ICD-10-CM | POA: Diagnosis not present

## 2023-06-04 DIAGNOSIS — E109 Type 1 diabetes mellitus without complications: Secondary | ICD-10-CM | POA: Diagnosis not present

## 2023-06-08 DIAGNOSIS — E109 Type 1 diabetes mellitus without complications: Secondary | ICD-10-CM | POA: Diagnosis not present

## 2023-06-11 DIAGNOSIS — E109 Type 1 diabetes mellitus without complications: Secondary | ICD-10-CM | POA: Diagnosis not present

## 2023-06-18 DIAGNOSIS — E109 Type 1 diabetes mellitus without complications: Secondary | ICD-10-CM | POA: Diagnosis not present

## 2023-06-25 DIAGNOSIS — E109 Type 1 diabetes mellitus without complications: Secondary | ICD-10-CM | POA: Diagnosis not present

## 2023-06-30 DIAGNOSIS — K080 Exfoliation of teeth due to systemic causes: Secondary | ICD-10-CM | POA: Diagnosis not present

## 2023-07-02 DIAGNOSIS — E109 Type 1 diabetes mellitus without complications: Secondary | ICD-10-CM | POA: Diagnosis not present

## 2023-07-31 DIAGNOSIS — E1039 Type 1 diabetes mellitus with other diabetic ophthalmic complication: Secondary | ICD-10-CM | POA: Diagnosis not present

## 2023-07-31 DIAGNOSIS — E109 Type 1 diabetes mellitus without complications: Secondary | ICD-10-CM | POA: Diagnosis not present

## 2023-08-04 DIAGNOSIS — H05402 Unspecified enophthalmos, left eye: Secondary | ICD-10-CM | POA: Diagnosis not present

## 2023-08-04 DIAGNOSIS — H44522 Atrophy of globe, left eye: Secondary | ICD-10-CM | POA: Diagnosis not present

## 2023-08-04 DIAGNOSIS — H541151 Blindness right eye category 5, low vision left eye category 1: Secondary | ICD-10-CM | POA: Diagnosis not present

## 2023-08-04 DIAGNOSIS — H18892 Other specified disorders of cornea, left eye: Secondary | ICD-10-CM | POA: Diagnosis not present

## 2023-08-04 DIAGNOSIS — H18462 Peripheral corneal degeneration, left eye: Secondary | ICD-10-CM | POA: Diagnosis not present

## 2023-08-04 DIAGNOSIS — H05422 Enophthalmos due to trauma or surgery, left eye: Secondary | ICD-10-CM | POA: Diagnosis not present

## 2023-08-04 DIAGNOSIS — H5442A5 Blindness left eye category 5, normal vision right eye: Secondary | ICD-10-CM | POA: Diagnosis not present

## 2023-08-20 DIAGNOSIS — E1039 Type 1 diabetes mellitus with other diabetic ophthalmic complication: Secondary | ICD-10-CM | POA: Diagnosis not present

## 2023-08-20 DIAGNOSIS — E109 Type 1 diabetes mellitus without complications: Secondary | ICD-10-CM | POA: Diagnosis not present

## 2023-08-21 DIAGNOSIS — E109 Type 1 diabetes mellitus without complications: Secondary | ICD-10-CM | POA: Diagnosis not present

## 2023-08-25 DIAGNOSIS — E1039 Type 1 diabetes mellitus with other diabetic ophthalmic complication: Secondary | ICD-10-CM | POA: Diagnosis not present

## 2023-08-25 DIAGNOSIS — I10 Essential (primary) hypertension: Secondary | ICD-10-CM | POA: Diagnosis not present

## 2023-08-25 DIAGNOSIS — Z23 Encounter for immunization: Secondary | ICD-10-CM | POA: Diagnosis not present

## 2023-08-27 DIAGNOSIS — E109 Type 1 diabetes mellitus without complications: Secondary | ICD-10-CM | POA: Diagnosis not present

## 2023-08-30 DIAGNOSIS — E109 Type 1 diabetes mellitus without complications: Secondary | ICD-10-CM | POA: Diagnosis not present

## 2023-08-30 DIAGNOSIS — E1039 Type 1 diabetes mellitus with other diabetic ophthalmic complication: Secondary | ICD-10-CM | POA: Diagnosis not present

## 2023-09-03 DIAGNOSIS — E109 Type 1 diabetes mellitus without complications: Secondary | ICD-10-CM | POA: Diagnosis not present

## 2023-09-10 DIAGNOSIS — E109 Type 1 diabetes mellitus without complications: Secondary | ICD-10-CM | POA: Diagnosis not present

## 2023-09-15 DIAGNOSIS — E113291 Type 2 diabetes mellitus with mild nonproliferative diabetic retinopathy without macular edema, right eye: Secondary | ICD-10-CM | POA: Diagnosis not present

## 2023-09-15 DIAGNOSIS — H02402 Unspecified ptosis of left eyelid: Secondary | ICD-10-CM | POA: Diagnosis not present

## 2023-09-15 DIAGNOSIS — Z961 Presence of intraocular lens: Secondary | ICD-10-CM | POA: Diagnosis not present

## 2023-09-15 DIAGNOSIS — H18411 Arcus senilis, right eye: Secondary | ICD-10-CM | POA: Diagnosis not present

## 2023-09-17 DIAGNOSIS — L821 Other seborrheic keratosis: Secondary | ICD-10-CM | POA: Diagnosis not present

## 2023-09-17 DIAGNOSIS — D225 Melanocytic nevi of trunk: Secondary | ICD-10-CM | POA: Diagnosis not present

## 2023-09-17 DIAGNOSIS — E109 Type 1 diabetes mellitus without complications: Secondary | ICD-10-CM | POA: Diagnosis not present

## 2023-09-17 DIAGNOSIS — L538 Other specified erythematous conditions: Secondary | ICD-10-CM | POA: Diagnosis not present

## 2023-09-17 DIAGNOSIS — Z08 Encounter for follow-up examination after completed treatment for malignant neoplasm: Secondary | ICD-10-CM | POA: Diagnosis not present

## 2023-09-17 DIAGNOSIS — L814 Other melanin hyperpigmentation: Secondary | ICD-10-CM | POA: Diagnosis not present

## 2023-09-17 DIAGNOSIS — L570 Actinic keratosis: Secondary | ICD-10-CM | POA: Diagnosis not present

## 2023-09-17 DIAGNOSIS — L820 Inflamed seborrheic keratosis: Secondary | ICD-10-CM | POA: Diagnosis not present

## 2023-09-22 DIAGNOSIS — I10 Essential (primary) hypertension: Secondary | ICD-10-CM | POA: Diagnosis not present

## 2023-09-22 DIAGNOSIS — E1039 Type 1 diabetes mellitus with other diabetic ophthalmic complication: Secondary | ICD-10-CM | POA: Diagnosis not present

## 2023-09-24 DIAGNOSIS — E109 Type 1 diabetes mellitus without complications: Secondary | ICD-10-CM | POA: Diagnosis not present

## 2023-09-29 DIAGNOSIS — E1039 Type 1 diabetes mellitus with other diabetic ophthalmic complication: Secondary | ICD-10-CM | POA: Diagnosis not present

## 2023-09-29 DIAGNOSIS — H524 Presbyopia: Secondary | ICD-10-CM | POA: Diagnosis not present

## 2023-09-29 DIAGNOSIS — E109 Type 1 diabetes mellitus without complications: Secondary | ICD-10-CM | POA: Diagnosis not present

## 2023-10-01 DIAGNOSIS — E109 Type 1 diabetes mellitus without complications: Secondary | ICD-10-CM | POA: Diagnosis not present

## 2023-10-08 DIAGNOSIS — E109 Type 1 diabetes mellitus without complications: Secondary | ICD-10-CM | POA: Diagnosis not present

## 2023-10-15 DIAGNOSIS — E109 Type 1 diabetes mellitus without complications: Secondary | ICD-10-CM | POA: Diagnosis not present

## 2023-10-22 DIAGNOSIS — E109 Type 1 diabetes mellitus without complications: Secondary | ICD-10-CM | POA: Diagnosis not present

## 2023-10-29 DIAGNOSIS — E109 Type 1 diabetes mellitus without complications: Secondary | ICD-10-CM | POA: Diagnosis not present

## 2023-11-05 DIAGNOSIS — E109 Type 1 diabetes mellitus without complications: Secondary | ICD-10-CM | POA: Diagnosis not present

## 2023-11-12 DIAGNOSIS — E109 Type 1 diabetes mellitus without complications: Secondary | ICD-10-CM | POA: Diagnosis not present

## 2023-11-23 DIAGNOSIS — E103551 Type 1 diabetes mellitus with stable proliferative diabetic retinopathy, right eye: Secondary | ICD-10-CM | POA: Diagnosis not present

## 2023-11-26 DIAGNOSIS — Q111 Other anophthalmos: Secondary | ICD-10-CM | POA: Diagnosis not present

## 2023-11-26 DIAGNOSIS — H0279 Other degenerative disorders of eyelid and periocular area: Secondary | ICD-10-CM | POA: Diagnosis not present

## 2023-11-26 DIAGNOSIS — K080 Exfoliation of teeth due to systemic causes: Secondary | ICD-10-CM | POA: Diagnosis not present

## 2023-12-07 DIAGNOSIS — Z794 Long term (current) use of insulin: Secondary | ICD-10-CM | POA: Diagnosis not present

## 2023-12-07 DIAGNOSIS — E109 Type 1 diabetes mellitus without complications: Secondary | ICD-10-CM | POA: Diagnosis not present

## 2023-12-07 DIAGNOSIS — E108 Type 1 diabetes mellitus with unspecified complications: Secondary | ICD-10-CM | POA: Diagnosis not present

## 2023-12-09 DIAGNOSIS — E108 Type 1 diabetes mellitus with unspecified complications: Secondary | ICD-10-CM | POA: Diagnosis not present

## 2023-12-09 DIAGNOSIS — Z794 Long term (current) use of insulin: Secondary | ICD-10-CM | POA: Diagnosis not present

## 2023-12-10 DIAGNOSIS — Z794 Long term (current) use of insulin: Secondary | ICD-10-CM | POA: Diagnosis not present

## 2023-12-10 DIAGNOSIS — E108 Type 1 diabetes mellitus with unspecified complications: Secondary | ICD-10-CM | POA: Diagnosis not present

## 2023-12-16 DIAGNOSIS — Z794 Long term (current) use of insulin: Secondary | ICD-10-CM | POA: Diagnosis not present

## 2023-12-16 DIAGNOSIS — E108 Type 1 diabetes mellitus with unspecified complications: Secondary | ICD-10-CM | POA: Diagnosis not present

## 2023-12-23 DIAGNOSIS — E108 Type 1 diabetes mellitus with unspecified complications: Secondary | ICD-10-CM | POA: Diagnosis not present

## 2023-12-23 DIAGNOSIS — Z794 Long term (current) use of insulin: Secondary | ICD-10-CM | POA: Diagnosis not present

## 2023-12-24 DIAGNOSIS — I10 Essential (primary) hypertension: Secondary | ICD-10-CM | POA: Diagnosis not present

## 2023-12-24 DIAGNOSIS — E1039 Type 1 diabetes mellitus with other diabetic ophthalmic complication: Secondary | ICD-10-CM | POA: Diagnosis not present

## 2023-12-30 DIAGNOSIS — Z794 Long term (current) use of insulin: Secondary | ICD-10-CM | POA: Diagnosis not present

## 2024-01-06 DIAGNOSIS — Z794 Long term (current) use of insulin: Secondary | ICD-10-CM | POA: Diagnosis not present

## 2024-01-06 DIAGNOSIS — E108 Type 1 diabetes mellitus with unspecified complications: Secondary | ICD-10-CM | POA: Diagnosis not present

## 2024-01-09 DIAGNOSIS — E108 Type 1 diabetes mellitus with unspecified complications: Secondary | ICD-10-CM | POA: Diagnosis not present

## 2024-01-09 DIAGNOSIS — Z794 Long term (current) use of insulin: Secondary | ICD-10-CM | POA: Diagnosis not present

## 2024-01-13 DIAGNOSIS — E108 Type 1 diabetes mellitus with unspecified complications: Secondary | ICD-10-CM | POA: Diagnosis not present

## 2024-01-13 DIAGNOSIS — Z794 Long term (current) use of insulin: Secondary | ICD-10-CM | POA: Diagnosis not present

## 2024-01-14 DIAGNOSIS — E785 Hyperlipidemia, unspecified: Secondary | ICD-10-CM | POA: Diagnosis not present

## 2024-01-14 DIAGNOSIS — N400 Enlarged prostate without lower urinary tract symptoms: Secondary | ICD-10-CM | POA: Diagnosis not present

## 2024-01-14 DIAGNOSIS — E103591 Type 1 diabetes mellitus with proliferative diabetic retinopathy without macular edema, right eye: Secondary | ICD-10-CM | POA: Diagnosis not present

## 2024-01-20 DIAGNOSIS — E108 Type 1 diabetes mellitus with unspecified complications: Secondary | ICD-10-CM | POA: Diagnosis not present

## 2024-01-20 DIAGNOSIS — Z794 Long term (current) use of insulin: Secondary | ICD-10-CM | POA: Diagnosis not present

## 2024-01-21 DIAGNOSIS — Z0000 Encounter for general adult medical examination without abnormal findings: Secondary | ICD-10-CM | POA: Diagnosis not present

## 2024-01-21 DIAGNOSIS — Z1331 Encounter for screening for depression: Secondary | ICD-10-CM | POA: Diagnosis not present

## 2024-01-21 DIAGNOSIS — R82998 Other abnormal findings in urine: Secondary | ICD-10-CM | POA: Diagnosis not present

## 2024-01-21 DIAGNOSIS — Z1339 Encounter for screening examination for other mental health and behavioral disorders: Secondary | ICD-10-CM | POA: Diagnosis not present

## 2024-01-21 DIAGNOSIS — I119 Hypertensive heart disease without heart failure: Secondary | ICD-10-CM | POA: Diagnosis not present

## 2024-01-21 DIAGNOSIS — I480 Paroxysmal atrial fibrillation: Secondary | ICD-10-CM | POA: Diagnosis not present

## 2024-01-27 DIAGNOSIS — Z794 Long term (current) use of insulin: Secondary | ICD-10-CM | POA: Diagnosis not present

## 2024-01-27 DIAGNOSIS — E108 Type 1 diabetes mellitus with unspecified complications: Secondary | ICD-10-CM | POA: Diagnosis not present

## 2024-02-03 DIAGNOSIS — E108 Type 1 diabetes mellitus with unspecified complications: Secondary | ICD-10-CM | POA: Diagnosis not present

## 2024-02-03 DIAGNOSIS — Z794 Long term (current) use of insulin: Secondary | ICD-10-CM | POA: Diagnosis not present

## 2024-02-10 DIAGNOSIS — Z794 Long term (current) use of insulin: Secondary | ICD-10-CM | POA: Diagnosis not present

## 2024-02-17 DIAGNOSIS — Z794 Long term (current) use of insulin: Secondary | ICD-10-CM | POA: Diagnosis not present

## 2024-02-24 DIAGNOSIS — E108 Type 1 diabetes mellitus with unspecified complications: Secondary | ICD-10-CM | POA: Diagnosis not present

## 2024-02-24 DIAGNOSIS — Z794 Long term (current) use of insulin: Secondary | ICD-10-CM | POA: Diagnosis not present

## 2024-03-02 DIAGNOSIS — E108 Type 1 diabetes mellitus with unspecified complications: Secondary | ICD-10-CM | POA: Diagnosis not present

## 2024-03-02 DIAGNOSIS — Z794 Long term (current) use of insulin: Secondary | ICD-10-CM | POA: Diagnosis not present

## 2024-03-16 DIAGNOSIS — L814 Other melanin hyperpigmentation: Secondary | ICD-10-CM | POA: Diagnosis not present

## 2024-03-16 DIAGNOSIS — D225 Melanocytic nevi of trunk: Secondary | ICD-10-CM | POA: Diagnosis not present

## 2024-03-16 DIAGNOSIS — L821 Other seborrheic keratosis: Secondary | ICD-10-CM | POA: Diagnosis not present

## 2024-03-16 DIAGNOSIS — S00461A Insect bite (nonvenomous) of right ear, initial encounter: Secondary | ICD-10-CM | POA: Diagnosis not present

## 2024-03-21 DIAGNOSIS — E108 Type 1 diabetes mellitus with unspecified complications: Secondary | ICD-10-CM | POA: Diagnosis not present

## 2024-03-21 DIAGNOSIS — Z794 Long term (current) use of insulin: Secondary | ICD-10-CM | POA: Diagnosis not present

## 2024-03-21 DIAGNOSIS — E1039 Type 1 diabetes mellitus with other diabetic ophthalmic complication: Secondary | ICD-10-CM | POA: Diagnosis not present

## 2024-03-21 DIAGNOSIS — E109 Type 1 diabetes mellitus without complications: Secondary | ICD-10-CM | POA: Diagnosis not present

## 2024-03-22 DIAGNOSIS — E1039 Type 1 diabetes mellitus with other diabetic ophthalmic complication: Secondary | ICD-10-CM | POA: Diagnosis not present

## 2024-03-22 DIAGNOSIS — I10 Essential (primary) hypertension: Secondary | ICD-10-CM | POA: Diagnosis not present

## 2024-03-28 DIAGNOSIS — E109 Type 1 diabetes mellitus without complications: Secondary | ICD-10-CM | POA: Diagnosis not present

## 2024-03-28 DIAGNOSIS — E1039 Type 1 diabetes mellitus with other diabetic ophthalmic complication: Secondary | ICD-10-CM | POA: Diagnosis not present

## 2024-03-28 DIAGNOSIS — Z794 Long term (current) use of insulin: Secondary | ICD-10-CM | POA: Diagnosis not present

## 2024-03-28 DIAGNOSIS — E108 Type 1 diabetes mellitus with unspecified complications: Secondary | ICD-10-CM | POA: Diagnosis not present

## 2024-04-04 DIAGNOSIS — E1039 Type 1 diabetes mellitus with other diabetic ophthalmic complication: Secondary | ICD-10-CM | POA: Diagnosis not present

## 2024-04-04 DIAGNOSIS — E108 Type 1 diabetes mellitus with unspecified complications: Secondary | ICD-10-CM | POA: Diagnosis not present

## 2024-04-04 DIAGNOSIS — Z794 Long term (current) use of insulin: Secondary | ICD-10-CM | POA: Diagnosis not present

## 2024-04-04 DIAGNOSIS — E109 Type 1 diabetes mellitus without complications: Secondary | ICD-10-CM | POA: Diagnosis not present

## 2024-04-11 DIAGNOSIS — E1039 Type 1 diabetes mellitus with other diabetic ophthalmic complication: Secondary | ICD-10-CM | POA: Diagnosis not present

## 2024-04-11 DIAGNOSIS — E109 Type 1 diabetes mellitus without complications: Secondary | ICD-10-CM | POA: Diagnosis not present

## 2024-04-11 DIAGNOSIS — Z794 Long term (current) use of insulin: Secondary | ICD-10-CM | POA: Diagnosis not present

## 2024-04-11 DIAGNOSIS — E108 Type 1 diabetes mellitus with unspecified complications: Secondary | ICD-10-CM | POA: Diagnosis not present

## 2024-04-18 DIAGNOSIS — E108 Type 1 diabetes mellitus with unspecified complications: Secondary | ICD-10-CM | POA: Diagnosis not present

## 2024-04-18 DIAGNOSIS — E109 Type 1 diabetes mellitus without complications: Secondary | ICD-10-CM | POA: Diagnosis not present

## 2024-04-18 DIAGNOSIS — Z794 Long term (current) use of insulin: Secondary | ICD-10-CM | POA: Diagnosis not present

## 2024-04-18 DIAGNOSIS — E1039 Type 1 diabetes mellitus with other diabetic ophthalmic complication: Secondary | ICD-10-CM | POA: Diagnosis not present

## 2024-04-20 DIAGNOSIS — E108 Type 1 diabetes mellitus with unspecified complications: Secondary | ICD-10-CM | POA: Diagnosis not present

## 2024-04-20 DIAGNOSIS — Z794 Long term (current) use of insulin: Secondary | ICD-10-CM | POA: Diagnosis not present

## 2024-04-25 DIAGNOSIS — E109 Type 1 diabetes mellitus without complications: Secondary | ICD-10-CM | POA: Diagnosis not present

## 2024-04-25 DIAGNOSIS — E108 Type 1 diabetes mellitus with unspecified complications: Secondary | ICD-10-CM | POA: Diagnosis not present

## 2024-04-25 DIAGNOSIS — E1039 Type 1 diabetes mellitus with other diabetic ophthalmic complication: Secondary | ICD-10-CM | POA: Diagnosis not present

## 2024-04-25 DIAGNOSIS — Z794 Long term (current) use of insulin: Secondary | ICD-10-CM | POA: Diagnosis not present

## 2024-05-02 DIAGNOSIS — Z794 Long term (current) use of insulin: Secondary | ICD-10-CM | POA: Diagnosis not present

## 2024-05-02 DIAGNOSIS — E1039 Type 1 diabetes mellitus with other diabetic ophthalmic complication: Secondary | ICD-10-CM | POA: Diagnosis not present

## 2024-05-02 DIAGNOSIS — E109 Type 1 diabetes mellitus without complications: Secondary | ICD-10-CM | POA: Diagnosis not present

## 2024-05-02 DIAGNOSIS — E108 Type 1 diabetes mellitus with unspecified complications: Secondary | ICD-10-CM | POA: Diagnosis not present

## 2024-05-09 DIAGNOSIS — Z794 Long term (current) use of insulin: Secondary | ICD-10-CM | POA: Diagnosis not present

## 2024-05-09 DIAGNOSIS — E108 Type 1 diabetes mellitus with unspecified complications: Secondary | ICD-10-CM | POA: Diagnosis not present

## 2024-05-09 DIAGNOSIS — E1039 Type 1 diabetes mellitus with other diabetic ophthalmic complication: Secondary | ICD-10-CM | POA: Diagnosis not present

## 2024-05-09 DIAGNOSIS — E109 Type 1 diabetes mellitus without complications: Secondary | ICD-10-CM | POA: Diagnosis not present

## 2024-05-16 DIAGNOSIS — Z794 Long term (current) use of insulin: Secondary | ICD-10-CM | POA: Diagnosis not present

## 2024-05-16 DIAGNOSIS — E1039 Type 1 diabetes mellitus with other diabetic ophthalmic complication: Secondary | ICD-10-CM | POA: Diagnosis not present

## 2024-05-16 DIAGNOSIS — E109 Type 1 diabetes mellitus without complications: Secondary | ICD-10-CM | POA: Diagnosis not present

## 2024-05-16 DIAGNOSIS — E108 Type 1 diabetes mellitus with unspecified complications: Secondary | ICD-10-CM | POA: Diagnosis not present

## 2024-05-20 DIAGNOSIS — Z794 Long term (current) use of insulin: Secondary | ICD-10-CM | POA: Diagnosis not present

## 2024-05-20 DIAGNOSIS — E108 Type 1 diabetes mellitus with unspecified complications: Secondary | ICD-10-CM | POA: Diagnosis not present

## 2024-05-23 DIAGNOSIS — E108 Type 1 diabetes mellitus with unspecified complications: Secondary | ICD-10-CM | POA: Diagnosis not present

## 2024-05-23 DIAGNOSIS — E1039 Type 1 diabetes mellitus with other diabetic ophthalmic complication: Secondary | ICD-10-CM | POA: Diagnosis not present

## 2024-05-23 DIAGNOSIS — Z794 Long term (current) use of insulin: Secondary | ICD-10-CM | POA: Diagnosis not present

## 2024-05-23 DIAGNOSIS — E109 Type 1 diabetes mellitus without complications: Secondary | ICD-10-CM | POA: Diagnosis not present

## 2024-05-24 DIAGNOSIS — E103591 Type 1 diabetes mellitus with proliferative diabetic retinopathy without macular edema, right eye: Secondary | ICD-10-CM | POA: Diagnosis not present

## 2024-05-24 DIAGNOSIS — I119 Hypertensive heart disease without heart failure: Secondary | ICD-10-CM | POA: Diagnosis not present

## 2024-05-25 DIAGNOSIS — Q111 Other anophthalmos: Secondary | ICD-10-CM | POA: Diagnosis not present

## 2024-05-25 DIAGNOSIS — H0279 Other degenerative disorders of eyelid and periocular area: Secondary | ICD-10-CM | POA: Diagnosis not present

## 2024-05-30 DIAGNOSIS — E108 Type 1 diabetes mellitus with unspecified complications: Secondary | ICD-10-CM | POA: Diagnosis not present

## 2024-05-30 DIAGNOSIS — E1039 Type 1 diabetes mellitus with other diabetic ophthalmic complication: Secondary | ICD-10-CM | POA: Diagnosis not present

## 2024-05-30 DIAGNOSIS — Z794 Long term (current) use of insulin: Secondary | ICD-10-CM | POA: Diagnosis not present

## 2024-05-30 DIAGNOSIS — E109 Type 1 diabetes mellitus without complications: Secondary | ICD-10-CM | POA: Diagnosis not present

## 2024-06-01 DIAGNOSIS — K080 Exfoliation of teeth due to systemic causes: Secondary | ICD-10-CM | POA: Diagnosis not present

## 2024-06-06 DIAGNOSIS — E109 Type 1 diabetes mellitus without complications: Secondary | ICD-10-CM | POA: Diagnosis not present

## 2024-06-06 DIAGNOSIS — Z794 Long term (current) use of insulin: Secondary | ICD-10-CM | POA: Diagnosis not present

## 2024-06-06 DIAGNOSIS — E1039 Type 1 diabetes mellitus with other diabetic ophthalmic complication: Secondary | ICD-10-CM | POA: Diagnosis not present

## 2024-06-06 DIAGNOSIS — E108 Type 1 diabetes mellitus with unspecified complications: Secondary | ICD-10-CM | POA: Diagnosis not present

## 2024-06-13 DIAGNOSIS — E1039 Type 1 diabetes mellitus with other diabetic ophthalmic complication: Secondary | ICD-10-CM | POA: Diagnosis not present

## 2024-06-13 DIAGNOSIS — Z794 Long term (current) use of insulin: Secondary | ICD-10-CM | POA: Diagnosis not present

## 2024-06-13 DIAGNOSIS — E108 Type 1 diabetes mellitus with unspecified complications: Secondary | ICD-10-CM | POA: Diagnosis not present

## 2024-06-13 DIAGNOSIS — E109 Type 1 diabetes mellitus without complications: Secondary | ICD-10-CM | POA: Diagnosis not present

## 2024-06-21 DIAGNOSIS — Z794 Long term (current) use of insulin: Secondary | ICD-10-CM | POA: Diagnosis not present

## 2024-06-21 DIAGNOSIS — E1039 Type 1 diabetes mellitus with other diabetic ophthalmic complication: Secondary | ICD-10-CM | POA: Diagnosis not present

## 2024-06-21 DIAGNOSIS — E108 Type 1 diabetes mellitus with unspecified complications: Secondary | ICD-10-CM | POA: Diagnosis not present

## 2024-06-21 DIAGNOSIS — E109 Type 1 diabetes mellitus without complications: Secondary | ICD-10-CM | POA: Diagnosis not present

## 2024-06-28 DIAGNOSIS — E108 Type 1 diabetes mellitus with unspecified complications: Secondary | ICD-10-CM | POA: Diagnosis not present

## 2024-06-28 DIAGNOSIS — E1039 Type 1 diabetes mellitus with other diabetic ophthalmic complication: Secondary | ICD-10-CM | POA: Diagnosis not present

## 2024-06-28 DIAGNOSIS — Z794 Long term (current) use of insulin: Secondary | ICD-10-CM | POA: Diagnosis not present

## 2024-07-05 DIAGNOSIS — Z794 Long term (current) use of insulin: Secondary | ICD-10-CM | POA: Diagnosis not present

## 2024-07-05 DIAGNOSIS — E1039 Type 1 diabetes mellitus with other diabetic ophthalmic complication: Secondary | ICD-10-CM | POA: Diagnosis not present

## 2024-07-05 DIAGNOSIS — E108 Type 1 diabetes mellitus with unspecified complications: Secondary | ICD-10-CM | POA: Diagnosis not present

## 2024-07-12 DIAGNOSIS — E109 Type 1 diabetes mellitus without complications: Secondary | ICD-10-CM | POA: Diagnosis not present

## 2024-07-12 DIAGNOSIS — E1039 Type 1 diabetes mellitus with other diabetic ophthalmic complication: Secondary | ICD-10-CM | POA: Diagnosis not present

## 2024-07-12 DIAGNOSIS — Z794 Long term (current) use of insulin: Secondary | ICD-10-CM | POA: Diagnosis not present

## 2024-07-12 DIAGNOSIS — E108 Type 1 diabetes mellitus with unspecified complications: Secondary | ICD-10-CM | POA: Diagnosis not present

## 2024-07-19 DIAGNOSIS — Z794 Long term (current) use of insulin: Secondary | ICD-10-CM | POA: Diagnosis not present

## 2024-07-19 DIAGNOSIS — E108 Type 1 diabetes mellitus with unspecified complications: Secondary | ICD-10-CM | POA: Diagnosis not present

## 2024-07-19 DIAGNOSIS — E1039 Type 1 diabetes mellitus with other diabetic ophthalmic complication: Secondary | ICD-10-CM | POA: Diagnosis not present

## 2024-07-19 DIAGNOSIS — E109 Type 1 diabetes mellitus without complications: Secondary | ICD-10-CM | POA: Diagnosis not present

## 2024-07-21 DIAGNOSIS — E108 Type 1 diabetes mellitus with unspecified complications: Secondary | ICD-10-CM | POA: Diagnosis not present

## 2024-07-21 DIAGNOSIS — Z794 Long term (current) use of insulin: Secondary | ICD-10-CM | POA: Diagnosis not present

## 2024-07-26 DIAGNOSIS — Z794 Long term (current) use of insulin: Secondary | ICD-10-CM | POA: Diagnosis not present

## 2024-07-26 DIAGNOSIS — E1039 Type 1 diabetes mellitus with other diabetic ophthalmic complication: Secondary | ICD-10-CM | POA: Diagnosis not present

## 2024-07-26 DIAGNOSIS — E108 Type 1 diabetes mellitus with unspecified complications: Secondary | ICD-10-CM | POA: Diagnosis not present

## 2024-08-02 DIAGNOSIS — E108 Type 1 diabetes mellitus with unspecified complications: Secondary | ICD-10-CM | POA: Diagnosis not present

## 2024-08-02 DIAGNOSIS — Z794 Long term (current) use of insulin: Secondary | ICD-10-CM | POA: Diagnosis not present

## 2024-08-02 DIAGNOSIS — E109 Type 1 diabetes mellitus without complications: Secondary | ICD-10-CM | POA: Diagnosis not present

## 2024-08-02 DIAGNOSIS — E1039 Type 1 diabetes mellitus with other diabetic ophthalmic complication: Secondary | ICD-10-CM | POA: Diagnosis not present

## 2024-08-09 DIAGNOSIS — Z794 Long term (current) use of insulin: Secondary | ICD-10-CM | POA: Diagnosis not present

## 2024-08-09 DIAGNOSIS — E109 Type 1 diabetes mellitus without complications: Secondary | ICD-10-CM | POA: Diagnosis not present

## 2024-08-09 DIAGNOSIS — E1039 Type 1 diabetes mellitus with other diabetic ophthalmic complication: Secondary | ICD-10-CM | POA: Diagnosis not present

## 2024-08-09 DIAGNOSIS — E108 Type 1 diabetes mellitus with unspecified complications: Secondary | ICD-10-CM | POA: Diagnosis not present

## 2024-08-16 DIAGNOSIS — E109 Type 1 diabetes mellitus without complications: Secondary | ICD-10-CM | POA: Diagnosis not present

## 2024-08-16 DIAGNOSIS — Z794 Long term (current) use of insulin: Secondary | ICD-10-CM | POA: Diagnosis not present

## 2024-08-16 DIAGNOSIS — E108 Type 1 diabetes mellitus with unspecified complications: Secondary | ICD-10-CM | POA: Diagnosis not present

## 2024-08-16 DIAGNOSIS — E1039 Type 1 diabetes mellitus with other diabetic ophthalmic complication: Secondary | ICD-10-CM | POA: Diagnosis not present

## 2024-08-18 DIAGNOSIS — E1039 Type 1 diabetes mellitus with other diabetic ophthalmic complication: Secondary | ICD-10-CM | POA: Diagnosis not present

## 2024-08-18 DIAGNOSIS — I10 Essential (primary) hypertension: Secondary | ICD-10-CM | POA: Diagnosis not present

## 2024-08-18 DIAGNOSIS — Z23 Encounter for immunization: Secondary | ICD-10-CM | POA: Diagnosis not present

## 2024-08-22 DIAGNOSIS — E103551 Type 1 diabetes mellitus with stable proliferative diabetic retinopathy, right eye: Secondary | ICD-10-CM | POA: Diagnosis not present

## 2024-08-23 DIAGNOSIS — E109 Type 1 diabetes mellitus without complications: Secondary | ICD-10-CM | POA: Diagnosis not present

## 2024-08-23 DIAGNOSIS — Z794 Long term (current) use of insulin: Secondary | ICD-10-CM | POA: Diagnosis not present

## 2024-08-23 DIAGNOSIS — E1039 Type 1 diabetes mellitus with other diabetic ophthalmic complication: Secondary | ICD-10-CM | POA: Diagnosis not present

## 2024-08-23 DIAGNOSIS — E108 Type 1 diabetes mellitus with unspecified complications: Secondary | ICD-10-CM | POA: Diagnosis not present

## 2024-08-30 DIAGNOSIS — Z794 Long term (current) use of insulin: Secondary | ICD-10-CM | POA: Diagnosis not present

## 2024-08-30 DIAGNOSIS — E108 Type 1 diabetes mellitus with unspecified complications: Secondary | ICD-10-CM | POA: Diagnosis not present

## 2024-08-30 DIAGNOSIS — E1039 Type 1 diabetes mellitus with other diabetic ophthalmic complication: Secondary | ICD-10-CM | POA: Diagnosis not present

## 2024-08-30 DIAGNOSIS — E109 Type 1 diabetes mellitus without complications: Secondary | ICD-10-CM | POA: Diagnosis not present

## 2024-09-06 DIAGNOSIS — Z794 Long term (current) use of insulin: Secondary | ICD-10-CM | POA: Diagnosis not present

## 2024-09-06 DIAGNOSIS — E108 Type 1 diabetes mellitus with unspecified complications: Secondary | ICD-10-CM | POA: Diagnosis not present

## 2024-09-06 DIAGNOSIS — E1039 Type 1 diabetes mellitus with other diabetic ophthalmic complication: Secondary | ICD-10-CM | POA: Diagnosis not present

## 2024-09-13 DIAGNOSIS — E1039 Type 1 diabetes mellitus with other diabetic ophthalmic complication: Secondary | ICD-10-CM | POA: Diagnosis not present

## 2024-09-13 DIAGNOSIS — E108 Type 1 diabetes mellitus with unspecified complications: Secondary | ICD-10-CM | POA: Diagnosis not present

## 2024-09-13 DIAGNOSIS — E109 Type 1 diabetes mellitus without complications: Secondary | ICD-10-CM | POA: Diagnosis not present

## 2024-09-13 DIAGNOSIS — Z794 Long term (current) use of insulin: Secondary | ICD-10-CM | POA: Diagnosis not present

## 2024-09-20 DIAGNOSIS — H02402 Unspecified ptosis of left eyelid: Secondary | ICD-10-CM | POA: Diagnosis not present

## 2024-09-20 DIAGNOSIS — E113291 Type 2 diabetes mellitus with mild nonproliferative diabetic retinopathy without macular edema, right eye: Secondary | ICD-10-CM | POA: Diagnosis not present

## 2024-09-20 DIAGNOSIS — H18411 Arcus senilis, right eye: Secondary | ICD-10-CM | POA: Diagnosis not present

## 2024-09-20 DIAGNOSIS — Z794 Long term (current) use of insulin: Secondary | ICD-10-CM | POA: Diagnosis not present

## 2024-09-20 DIAGNOSIS — Z961 Presence of intraocular lens: Secondary | ICD-10-CM | POA: Diagnosis not present

## 2024-09-22 DIAGNOSIS — E1039 Type 1 diabetes mellitus with other diabetic ophthalmic complication: Secondary | ICD-10-CM | POA: Diagnosis not present

## 2024-09-22 DIAGNOSIS — I10 Essential (primary) hypertension: Secondary | ICD-10-CM | POA: Diagnosis not present

## 2024-10-20 DIAGNOSIS — Z08 Encounter for follow-up examination after completed treatment for malignant neoplasm: Secondary | ICD-10-CM | POA: Diagnosis not present

## 2024-10-20 DIAGNOSIS — L821 Other seborrheic keratosis: Secondary | ICD-10-CM | POA: Diagnosis not present

## 2024-10-20 DIAGNOSIS — L814 Other melanin hyperpigmentation: Secondary | ICD-10-CM | POA: Diagnosis not present

## 2024-10-20 DIAGNOSIS — L570 Actinic keratosis: Secondary | ICD-10-CM | POA: Diagnosis not present

## 2024-10-20 DIAGNOSIS — D225 Melanocytic nevi of trunk: Secondary | ICD-10-CM | POA: Diagnosis not present
